# Patient Record
Sex: Male | Born: 1950 | Hispanic: No | Marital: Married | Smoking: Never smoker
Health system: Southern US, Community
[De-identification: ages and names within clinical notes are randomized; demographics above are authoritative.]

## PROBLEM LIST (undated history)

## (undated) DIAGNOSIS — M109 Gout, unspecified: Secondary | ICD-10-CM

## (undated) DIAGNOSIS — E781 Pure hyperglyceridemia: Secondary | ICD-10-CM

## (undated) DIAGNOSIS — H3530 Unspecified macular degeneration: Secondary | ICD-10-CM

## (undated) HISTORY — DX: Unspecified macular degeneration: H35.30

## (undated) HISTORY — PX: COLONOSCOPY WITH PROPOFOL: SHX5780

---
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
## 2007-12-04 ENCOUNTER — Ambulatory Visit: Payer: Self-pay

## 2008-12-30 IMAGING — CR DG HAND COMPLETE 3+V*L*
1 series · 3 of 3 positions shown · non-contrast
Comparison: none

REASON FOR EXAM: injury at work swollen 2nd
COMMENTS:

PROCEDURE:     DXR - DXR HAND LT COMPLETE  W/OBLIQUES  - December 04, 2007 [DATE]
RESULT:     No fracture, dislocation or other acute bony abnormality is
identified.

[Series 1: view not recorded · 0.17mm/px · 3 of 3 slices shown]
[im 1/3]
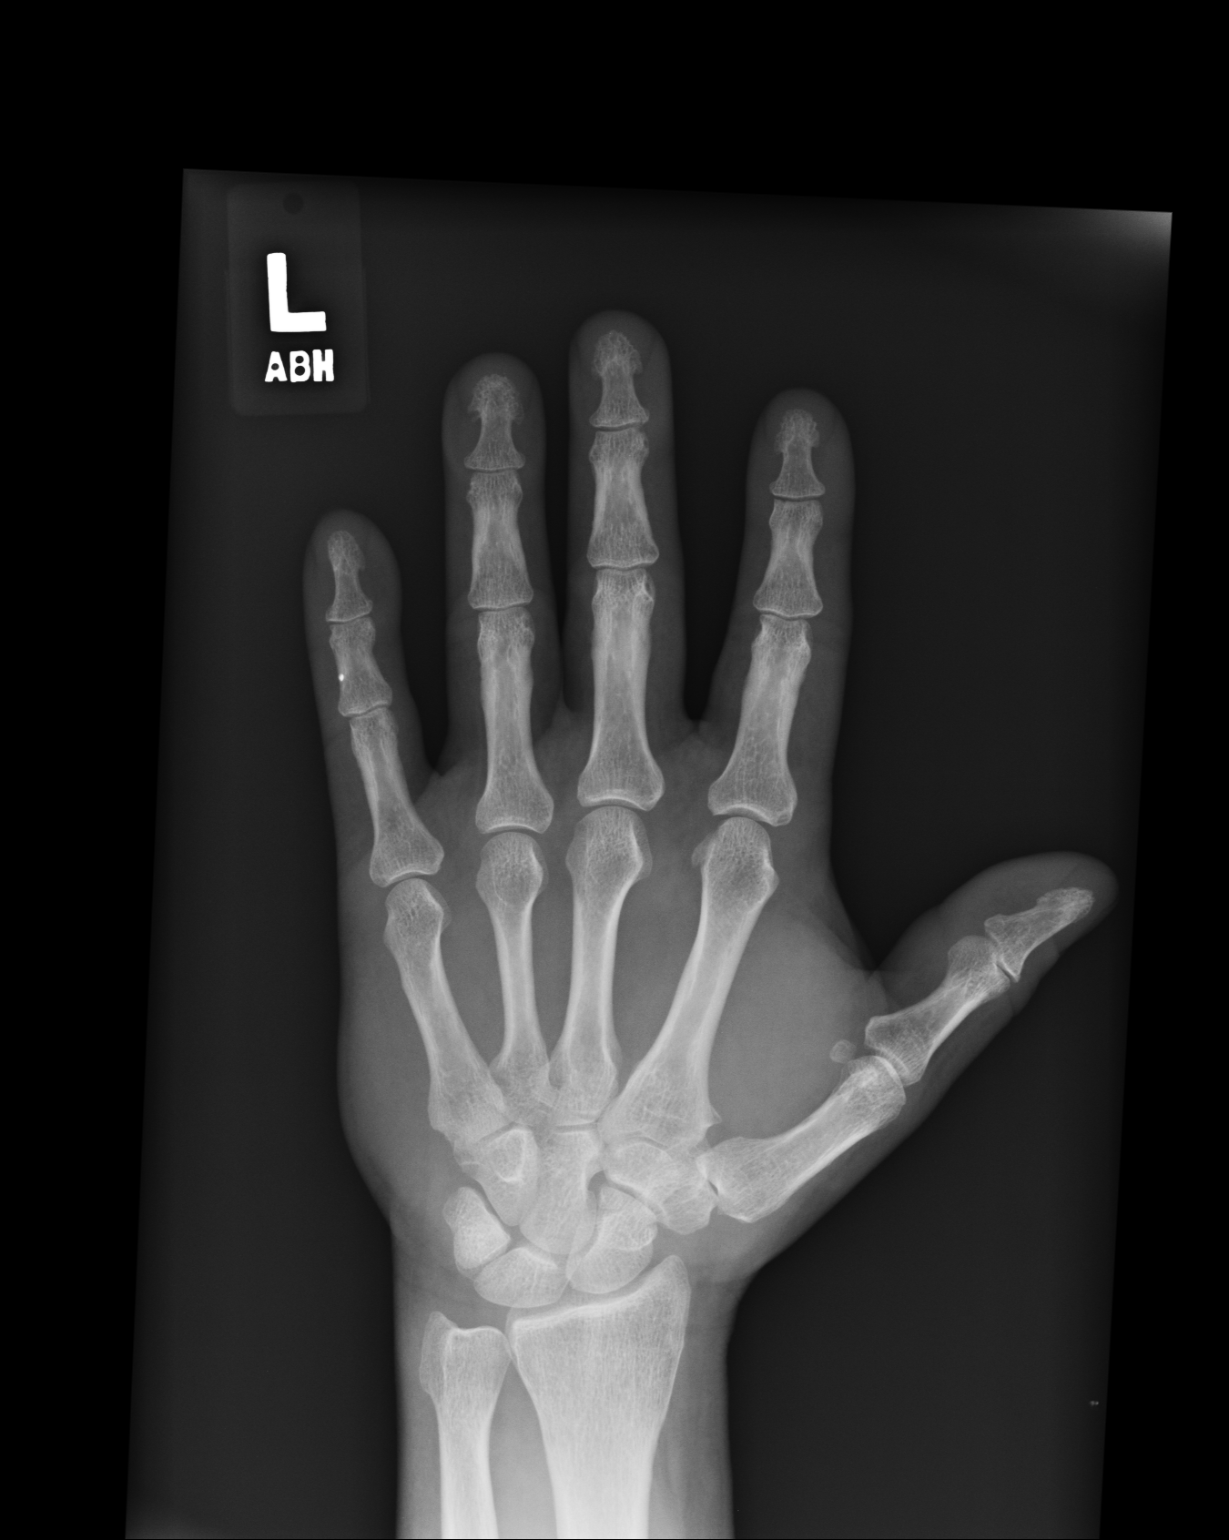
[im 2/3]
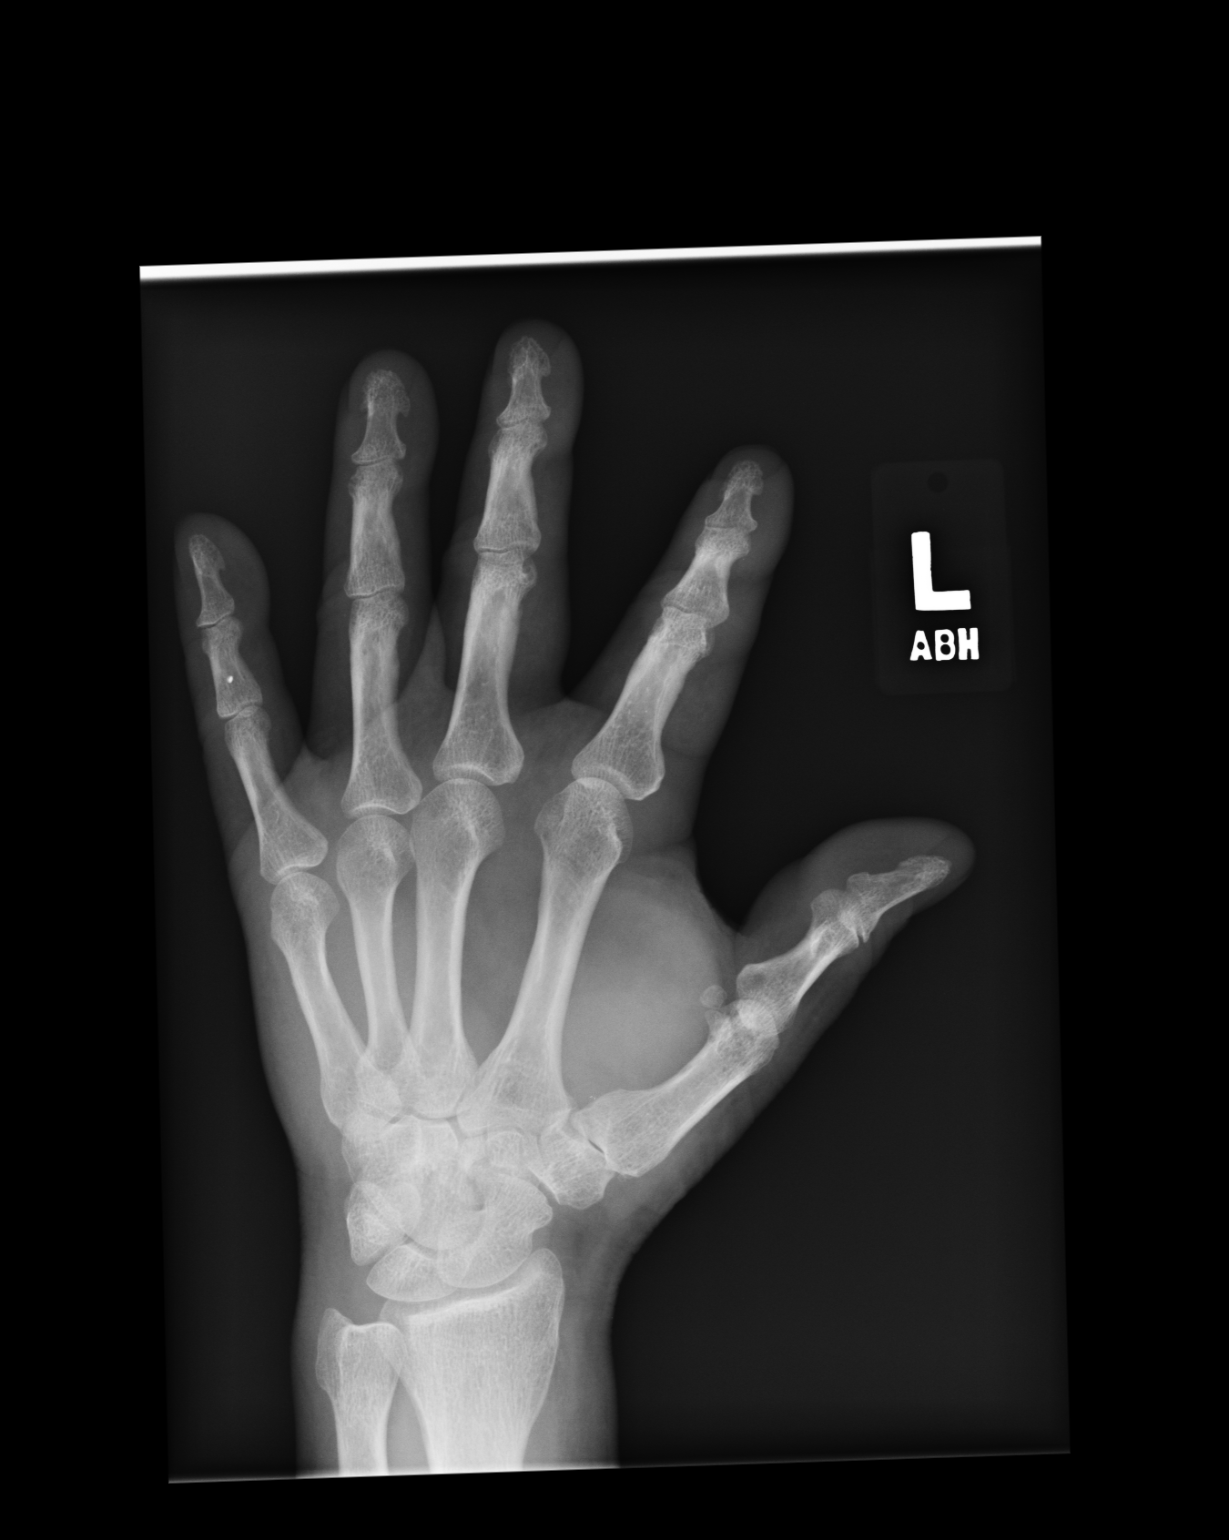
[im 3/3]
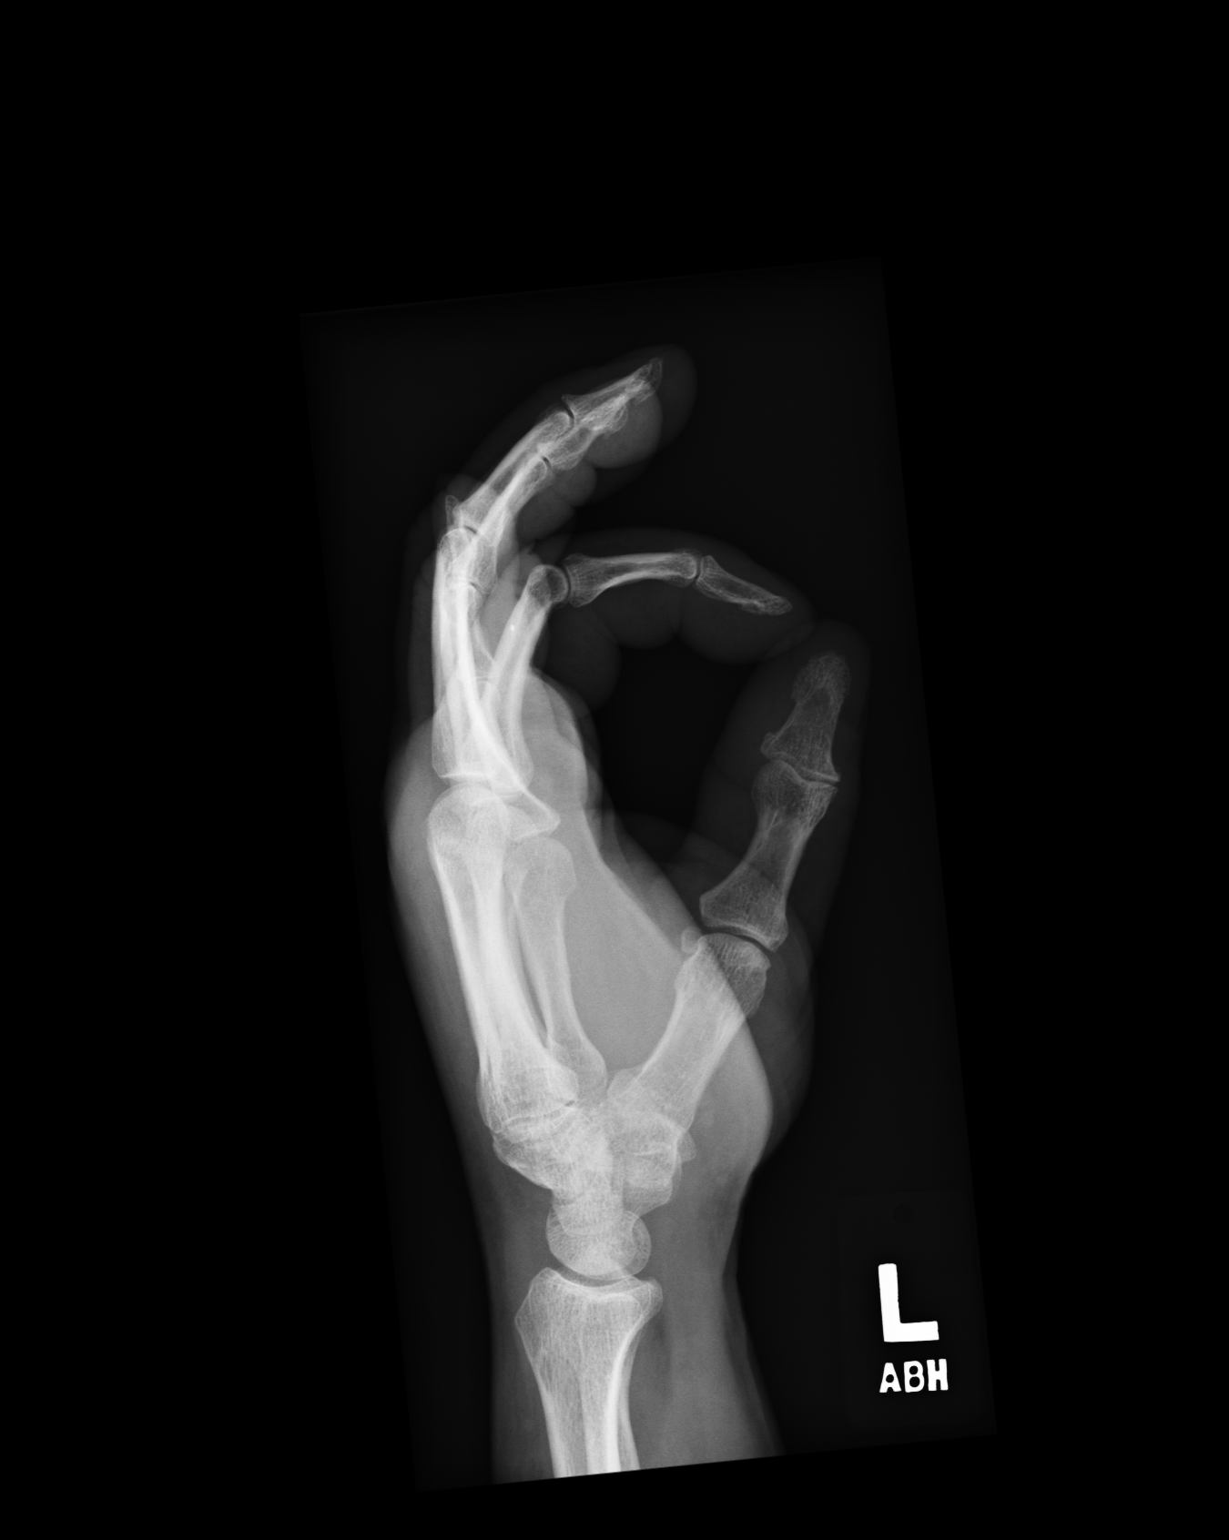

[3 of 3 positions shown; findings below may reference images not displayed]

IMPRESSION: 1. No significant osseous abnormalities are noted.
2. There is incidentally noted a tiny metallic density foreign body
projected over the soft tissues volar to the middle phalanx of the little
finger and consistent with residual change from prior injury.

## 2010-05-10 ENCOUNTER — Ambulatory Visit
Admission: RE | Admit: 2010-05-10 | Discharge: 2010-05-10 | Disposition: A | Discharge status: Home / Self Care | Payer: Worker's Compensation | Source: Ambulatory Visit | Attending: Orthopedic Surgery | Admitting: Orthopedic Surgery

## 2010-05-10 ENCOUNTER — Other Ambulatory Visit: Payer: Self-pay | Admitting: Orthopedic Surgery

## 2010-05-10 DIAGNOSIS — Z77018 Contact with and (suspected) exposure to other hazardous metals: Secondary | ICD-10-CM

## 2010-07-07 HISTORY — PX: ROTATOR CUFF REPAIR: SHX139

## 2012-04-25 ENCOUNTER — Ambulatory Visit: Payer: Self-pay | Admitting: Gastroenterology

## 2012-04-25 LAB — HM COLONOSCOPY

## 2015-05-31 ENCOUNTER — Encounter: Payer: Self-pay | Admitting: Family Medicine

## 2015-05-31 DIAGNOSIS — H3530 Unspecified macular degeneration: Secondary | ICD-10-CM | POA: Insufficient documentation

## 2015-07-08 ENCOUNTER — Ambulatory Visit (INDEPENDENT_AMBULATORY_CARE_PROVIDER_SITE_OTHER): Payer: Managed Care, Other (non HMO) | Admitting: Family Medicine

## 2015-07-08 ENCOUNTER — Encounter: Payer: Self-pay | Admitting: Family Medicine

## 2015-07-08 VITALS — BP 144/102 | HR 68 | Temp 98.4°F | Resp 18 | Ht 68.5 in | Wt 213.0 lb

## 2015-07-08 DIAGNOSIS — Z0000 Encounter for general adult medical examination without abnormal findings: Secondary | ICD-10-CM | POA: Diagnosis not present

## 2015-07-08 DIAGNOSIS — Z23 Encounter for immunization: Secondary | ICD-10-CM | POA: Diagnosis not present

## 2015-07-08 DIAGNOSIS — M1000 Idiopathic gout, unspecified site: Secondary | ICD-10-CM | POA: Diagnosis not present

## 2015-07-08 DIAGNOSIS — Z1159 Encounter for screening for other viral diseases: Secondary | ICD-10-CM | POA: Diagnosis not present

## 2015-07-08 LAB — CBC WITH DIFFERENTIAL/PLATELET
BASOS PCT: 1 % (ref 0–1)
Basophils Absolute: 0.1 10*3/uL (ref 0.0–0.1)
Eosinophils Absolute: 0.3 10*3/uL (ref 0.0–0.7)
Eosinophils Relative: 3 % (ref 0–5)
HEMATOCRIT: 44.8 % (ref 39.0–52.0)
HEMOGLOBIN: 15.5 g/dL (ref 13.0–17.0)
LYMPHS ABS: 1.6 10*3/uL (ref 0.7–4.0)
LYMPHS PCT: 18 % (ref 12–46)
MCH: 31.2 pg (ref 26.0–34.0)
MCHC: 34.6 g/dL (ref 30.0–36.0)
MCV: 90.1 fL (ref 78.0–100.0)
MONO ABS: 0.7 10*3/uL (ref 0.1–1.0)
MONOS PCT: 8 % (ref 3–12)
MPV: 9.2 fL (ref 8.6–12.4)
NEUTROS ABS: 6.2 10*3/uL (ref 1.7–7.7)
Neutrophils Relative %: 70 % (ref 43–77)
Platelets: 264 10*3/uL (ref 150–400)
RBC: 4.97 MIL/uL (ref 4.22–5.81)
RDW: 13.6 % (ref 11.5–15.5)
WBC: 8.8 10*3/uL (ref 4.0–10.5)

## 2015-07-08 LAB — LIPID PANEL
CHOL/HDL RATIO: 3.8 ratio (ref <=5.0)
Cholesterol: 124 mg/dL — ABNORMAL LOW (ref 125–200)
HDL: 33 mg/dL — AB (ref >=40)
LDL CALC: 33 mg/dL (ref <130)
TRIGLYCERIDES: 288 mg/dL — AB (ref <150)
VLDL: 58 mg/dL — AB (ref <30)

## 2015-07-08 LAB — COMPLETE METABOLIC PANEL WITH GFR
ALBUMIN: 4.7 g/dL (ref 3.6–5.1)
ALK PHOS: 97 U/L (ref 40–115)
ALT: 25 U/L (ref 9–46)
AST: 27 U/L (ref 10–35)
BILIRUBIN TOTAL: 0.6 mg/dL (ref 0.2–1.2)
BUN: 16 mg/dL (ref 7–25)
CALCIUM: 9 mg/dL (ref 8.6–10.3)
CO2: 23 mmol/L (ref 20–31)
Chloride: 103 mmol/L (ref 98–110)
Creat: 0.87 mg/dL (ref 0.70–1.25)
Glucose, Bld: 103 mg/dL — ABNORMAL HIGH (ref 70–99)
POTASSIUM: 4.2 mmol/L (ref 3.5–5.3)
Sodium: 141 mmol/L (ref 135–146)
TOTAL PROTEIN: 7.1 g/dL (ref 6.1–8.1)

## 2015-07-08 LAB — HEPATITIS C ANTIBODY: HCV Ab: NEGATIVE

## 2015-07-08 LAB — URIC ACID: Uric Acid, Serum: 5.8 mg/dL (ref 4.0–7.8)

## 2015-07-08 NOTE — Addendum Note (Signed)
Addended by: Legrand RamsWILLIS, SANDY B on: 07/08/2015 08:48 AM   Modules accepted: Orders

## 2015-07-08 NOTE — Progress Notes (Signed)
Subjective:    Patient ID: Joshua Trevino., male    DOB: 01/21/1951, 65 y.o.   MRN: [id number]  HPI  Patient is a very pleasant 65 year old white male here today to establish care. Previously has been a patient of Dr. Vear Clock. Past medical history significant for macular degeneration as well as hypertriglyceridemia. He is due for prostate exam. He is due for the shingles vaccine as well as a tetanus vaccine. He also has a past medical history of gout. His last colonoscopy was performed in 2015 and was normal. He gets colonoscopy every 5 years because his father died from colon cancer. He is due for hepatitis C screening. His blood pressure today is elevated however the patient states that it is never high and it has always been normal. In fact he had normal blood pressure at a [employer] physical in December. Past Medical History  Diagnosis Date  . Macular degeneration    Past Surgical History  Procedure Laterality Date  . Appendectomy  1958  . Rotator cuff repair  07/07/2010    reattach Bicep also   Current Outpatient Prescriptions on File Prior to Visit  Medication Sig Dispense Refill  . allopurinol (ZYLOPRIM) 300 MG tablet Take 300 mg by mouth daily with supper.    Marland Kitchen gemfibrozil (LOPID) 600 MG tablet Take 600 mg by mouth daily with supper.    . Multiple Vitamin (MULTIVITAMIN) tablet Take 1 tablet by mouth daily with supper.    . Multiple Vitamins-Minerals (ICAPS) CAPS Take 2 capsules by mouth 2 (two) times daily.     No current facility-administered medications on file prior to visit.   No Known Allergies Social History   Social History  . Marital Status: Unknown    Spouse Name: N/A  . Number of Children: N/A  . Years of Education: N/A   Occupational History  . Not on file.   Social History Main Topics  . Smoking status: Never Smoker   . Smokeless tobacco: Never Used  . Alcohol Use: No  . Drug Use: No  . Sexual Activity: Not Currently   Other Topics Concern  . Not  on file   Social History Narrative   Family History  Problem Relation Age of Onset  . Hypertension Mother   . Macular degeneration Mother   . Cancer Father   . Macular degeneration Sister   . Early death Paternal Grandfather   . Tuberculosis Paternal Grandfather      Review of Systems  All other systems reviewed and are negative.      Objective:   Physical Exam  Constitutional: He is oriented to person, place, and time. He appears well-developed and well-nourished. No distress.  HENT:  Head: Normocephalic and atraumatic.  Right Ear: External ear normal.  Left Ear: External ear normal.  Nose: Nose normal.  Mouth/Throat: Oropharynx is clear and moist. No oropharyngeal exudate.  Eyes: Conjunctivae and EOM are normal. Pupils are equal, round, and reactive to light. Left eye exhibits no discharge. No scleral icterus.  Neck: Normal range of motion. Neck supple. No JVD present. No tracheal deviation present. No thyromegaly present.  Cardiovascular: Normal rate, regular rhythm, normal heart sounds and intact distal pulses.  Exam reveals no gallop and no friction rub.   No murmur heard. Pulmonary/Chest: Effort normal and breath sounds normal. No stridor. No respiratory distress. He has no wheezes. He has no rales. He exhibits no tenderness.  Abdominal: Soft. Bowel sounds are normal. He exhibits no distension and no  mass. There is no tenderness. There is no rebound and no guarding.  Genitourinary: Rectum normal, prostate normal and penis normal.  Musculoskeletal: Normal range of motion. He exhibits no edema or tenderness.  Lymphadenopathy:    He has no cervical adenopathy.  Neurological: He is alert and oriented to person, place, and time. He has normal reflexes. He displays normal reflexes. No cranial nerve deficit. He exhibits normal muscle tone. Coordination normal.  Skin: Skin is warm. No rash noted. No erythema. No pallor.  Psychiatric: He has a normal mood and affect. His  behavior is normal. Judgment and thought content normal.  Vitals reviewed.         Assessment & Plan:  Need for hepatitis C screening test - Plan: Hepatitis C Ab Reflex HCV RNA, QUANT  Routine general medical examination at a health care facility - Plan: CBC with Differential/Platelet, COMPLETE METABOLIC PANEL WITH GFR, Lipid panel, PSA, Hepatitis C Ab Reflex HCV RNA, QUANT  Physical exam today is normal except for his blood pressure. Patient will check his blood pressure several times over the next 2 weeks and provide me the values. If greater than 140/90, I would start losartan. I will screen the patient for hepatitis C today. Prostate exam is normal I will check a PSA. Colonoscopy is up-to-date. I recommended the shingles vaccine. He will get the tetanus vaccine today. He is not due for a pneumonia vaccine until 66. I will also check a CBC, CMP, fasting lipid panel. I will also check a uric acid level to ensure that his allopurinol was at the appropriate dosage. I am not a big exam of gemfibrozil due to the risk of myalgias and liver problems. I will check a fasting lipid panel and make decisions about possibly switching the patient based on the results.

## 2015-07-09 LAB — PSA: PSA: 1.58 ng/mL (ref <=4.00)

## 2015-07-11 ENCOUNTER — Encounter: Payer: Self-pay | Admitting: Family Medicine

## 2015-07-14 ENCOUNTER — Encounter: Payer: Self-pay | Admitting: *Deleted

## 2015-08-18 ENCOUNTER — Other Ambulatory Visit: Payer: Self-pay | Admitting: Family Medicine

## 2015-08-18 NOTE — Telephone Encounter (Signed)
Refill appropriate and filled per protocol. 

## 2015-08-24 ENCOUNTER — Other Ambulatory Visit: Payer: Self-pay | Admitting: *Deleted

## 2015-08-24 MED ORDER — GEMFIBROZIL 600 MG PO TABS: 600.0000 mg | ORAL_TABLET | Freq: Every day | ORAL | Status: DC

## 2015-08-24 MED ORDER — ALLOPURINOL 300 MG PO TABS: 300.0000 mg | ORAL_TABLET | Freq: Every day | ORAL | Status: DC

## 2015-08-24 NOTE — Telephone Encounter (Signed)
Received fax requesting refill on Allopurinol and Gemfibrozil.  Refill appropriate and filled per protocol.

## 2015-11-24 ENCOUNTER — Other Ambulatory Visit: Payer: Self-pay | Admitting: Family Medicine

## 2015-11-24 MED ORDER — GEMFIBROZIL 600 MG PO TABS: 600.0000 mg | ORAL_TABLET | Freq: Every day | ORAL | 1 refills | Status: DC

## 2015-11-24 MED ORDER — ALLOPURINOL 300 MG PO TABS: 300.0000 mg | ORAL_TABLET | Freq: Every day | ORAL | 1 refills | Status: DC

## 2015-11-24 NOTE — Telephone Encounter (Signed)
Medication called/sent to requested pharmacy  

## 2015-12-22 ENCOUNTER — Ambulatory Visit (INDEPENDENT_AMBULATORY_CARE_PROVIDER_SITE_OTHER): Payer: BLUE CROSS/BLUE SHIELD | Admitting: Family Medicine

## 2015-12-22 VITALS — BP 144/94 | HR 90 | Temp 98.1°F | Resp 18 | Wt 210.0 lb

## 2015-12-22 DIAGNOSIS — J019 Acute sinusitis, unspecified: Secondary | ICD-10-CM

## 2015-12-22 MED ORDER — AMOXICILLIN-POT CLAVULANATE 875-125 MG PO TABS: 1.0000 | ORAL_TABLET | Freq: Two times a day (BID) | ORAL | 0 refills | Status: DC

## 2015-12-22 NOTE — Progress Notes (Signed)
   Subjective:    Patient ID: Joshua Bouillonlem Calvin Magwood Jr., male    DOB: 01/21/1951, 65 y.o.   MRN: [id number]  HPI Patient reports 1-2 weeks of pressure behind his right eye. He is also having a headache in that area. He reports mild disequilibrium. He reports pressure and fullness in his ear. He reports head congestion. He reports that he gets similar symptoms when he develops a sinus infection in the past. He denies any pain or pressure in his maxillary sinus area he denies any sore throat. He does report some rhinorrhea and congestion. Past Medical History:  Diagnosis Date  . Macular degeneration    Past Surgical History:  Procedure Laterality Date  . APPENDECTOMY  1958  . ROTATOR CUFF REPAIR  07/07/2010   reattach Bicep also   Current Outpatient Prescriptions on File Prior to Visit  Medication Sig Dispense Refill  . allopurinol (ZYLOPRIM) 300 MG tablet Take 1 tablet (300 mg total) by mouth daily. 90 tablet 1  . gemfibrozil (LOPID) 600 MG tablet Take 1 tablet (600 mg total) by mouth daily. 90 tablet 1  . Multiple Vitamin (MULTIVITAMIN) tablet Take 1 tablet by mouth daily with supper.    . Multiple Vitamins-Minerals (ICAPS) CAPS Take 2 capsules by mouth 2 (two) times daily.    . diphenhydrAMINE (BENADRYL) 25 MG tablet Take 25 mg by mouth every 6 (six) hours as needed for allergies.     No current facility-administered medications on file prior to visit.    No Known Allergies Social History   Social History  . Marital status: Unknown    Spouse name: N/A  . Number of children: N/A  . Years of education: N/A   Occupational History  . Not on file.   Social History Main Topics  . Smoking status: Never Smoker  . Smokeless tobacco: Never Used  . Alcohol use No  . Drug use: No  . Sexual activity: Not Currently   Other Topics Concern  . Not on file   Social History Narrative  . No narrative on file      Review of Systems     Objective:   Physical Exam  Constitutional: He  is oriented to person, place, and time. He appears well-developed and well-nourished. No distress.  HENT:  Head: Normocephalic and atraumatic.  Right Ear: External ear and ear canal normal.  Left Ear: External ear and ear canal normal.  Nose: Mucosal edema present. No rhinorrhea. Right sinus exhibits frontal sinus tenderness.  Mouth/Throat: Oropharynx is clear and moist. No oropharyngeal exudate.  Cardiovascular: Normal rate, regular rhythm and normal heart sounds.   Pulmonary/Chest: Effort normal and breath sounds normal.  Neurological: He is alert and oriented to person, place, and time. He has normal reflexes. He displays normal reflexes. No cranial nerve deficit. He exhibits normal muscle tone. Coordination normal.  Skin: He is not diaphoretic.  Vitals reviewed.         Assessment & Plan:  Acute rhinosinusitis - Plan: amoxicillin-clavulanate (AUGMENTIN) 875-125 MG tablet  I suspect that he has sinusitis. Begin Augmentin 875 mg by mouth twice a day for 10 days. Recheck in one week if no better. Keep a closer eye on his blood pressure is elevated today.

## 2016-01-17 ENCOUNTER — Ambulatory Visit (INDEPENDENT_AMBULATORY_CARE_PROVIDER_SITE_OTHER): Payer: BLUE CROSS/BLUE SHIELD | Admitting: Family Medicine

## 2016-01-17 ENCOUNTER — Encounter: Payer: Self-pay | Admitting: Family Medicine

## 2016-01-17 VITALS — BP 142/94 | HR 72 | Temp 98.3°F | Resp 18 | Ht 68.5 in | Wt 214.0 lb

## 2016-01-17 DIAGNOSIS — R5383 Other fatigue: Secondary | ICD-10-CM

## 2016-01-17 LAB — COMPLETE METABOLIC PANEL WITH GFR
ALT: 24 U/L (ref 9–46)
AST: 28 U/L (ref 10–35)
Albumin: 4.3 g/dL (ref 3.6–5.1)
Alkaline Phosphatase: 93 U/L (ref 40–115)
BILIRUBIN TOTAL: 0.5 mg/dL (ref 0.2–1.2)
BUN: 15 mg/dL (ref 7–25)
CALCIUM: 9.1 mg/dL (ref 8.6–10.3)
CHLORIDE: 101 mmol/L (ref 98–110)
CO2: 29 mmol/L (ref 20–31)
Creat: 0.86 mg/dL (ref 0.70–1.25)
Glucose, Bld: 97 mg/dL (ref 70–99)
Potassium: 3.9 mmol/L (ref 3.5–5.3)
Sodium: 139 mmol/L (ref 135–146)
Total Protein: 7 g/dL (ref 6.1–8.1)

## 2016-01-17 LAB — CBC WITH DIFFERENTIAL/PLATELET
BASOS ABS: 82 {cells}/uL (ref 0–200)
Basophils Relative: 1 %
EOS ABS: 246 {cells}/uL (ref 15–500)
Eosinophils Relative: 3 %
HCT: 45.3 % (ref 38.5–50.0)
Hemoglobin: 15.1 g/dL (ref 13.0–17.0)
LYMPHS PCT: 19 %
Lymphs Abs: 1558 cells/uL (ref 850–3900)
MCH: 30.1 pg (ref 27.0–33.0)
MCHC: 33.3 g/dL (ref 32.0–36.0)
MCV: 90.2 fL (ref 80.0–100.0)
MONOS PCT: 9 %
MPV: 9.6 fL (ref 7.5–12.5)
Monocytes Absolute: 738 cells/uL (ref 200–950)
NEUTROS PCT: 68 %
Neutro Abs: 5576 cells/uL (ref 1500–7800)
PLATELETS: 282 10*3/uL (ref 140–400)
RBC: 5.02 MIL/uL (ref 4.20–5.80)
RDW: 13.8 % (ref 11.0–15.0)
WBC: 8.2 10*3/uL (ref 3.8–10.8)

## 2016-01-17 LAB — TSH: TSH: 1.16 m[IU]/L (ref 0.40–4.50)

## 2016-01-17 NOTE — Progress Notes (Signed)
Subjective:    Patient ID: Gwenyth Bouillonlem Calvin Soots Jr., male    DOB: 11/27/1950, 65 y.o.   MRN: [id number]  HPI  12/22/15 Patient reports 1-2 weeks of pressure behind his right eye. He is also having a headache in that area. He reports mild disequilibrium. He reports pressure and fullness in his ear. He reports head congestion. He reports that he gets similar symptoms when he develops a sinus infection in the past. He denies any pain or pressure in his maxillary sinus area he denies any sore throat. He does report some rhinorrhea and congestion.  At that time, my plan was: I suspect that he has sinusitis. Begin Augmentin 875 mg by mouth twice a day for 10 days. Recheck in one week if no better. Keep a closer eye on his blood pressure is elevated today.  01/17/16 Symptoms have not improved. He continues to have a dull constant headache. He also reports occasional mild disequilibrium. Now he is reporting severe fatigue. He just has no energy or desire to do anything. He is also having some mild blurry vision. He recently saw his eye doctor and his exam showed no significant change. He denies any chest pain shortness of breath or dyspnea on exertion. He denies any cough or hemoptysis. He denies any fevers or chills or night sweats or bruising. He denies any nausea vomiting or diarrhea. He denies any constipation. He denies any blood in his stools. He denies any blood in his urine or dysuria. He denies any bone pains are diffuse arthralgias are diffuse muscle pains. Past Medical History:  Diagnosis Date  . Macular degeneration    Past Surgical History:  Procedure Laterality Date  . APPENDECTOMY  1958  . ROTATOR CUFF REPAIR  07/07/2010   reattach Bicep also   Current Outpatient Prescriptions on File Prior to Visit  Medication Sig Dispense Refill  . allopurinol (ZYLOPRIM) 300 MG tablet Take 1 tablet (300 mg total) by mouth daily. 90 tablet 1  . diphenhydrAMINE (BENADRYL) 25 MG tablet Take 25 mg by  mouth every 6 (six) hours as needed for allergies.    Marland Kitchen. gemfibrozil (LOPID) 600 MG tablet Take 1 tablet (600 mg total) by mouth daily. 90 tablet 1  . Multiple Vitamin (MULTIVITAMIN) tablet Take 1 tablet by mouth daily with supper.    . Multiple Vitamins-Minerals (ICAPS) CAPS Take 2 capsules by mouth 2 (two) times daily.     No current facility-administered medications on file prior to visit.    No Known Allergies Social History   Social History  . Marital status: Unknown    Spouse name: N/A  . Number of children: N/A  . Years of education: N/A   Occupational History  . Not on file.   Social History Main Topics  . Smoking status: Never Smoker  . Smokeless tobacco: Never Used  . Alcohol use No  . Drug use: No  . Sexual activity: Not Currently   Other Topics Concern  . Not on file   Social History Narrative  . No narrative on file      Review of Systems     Objective:   Physical Exam  Constitutional: He is oriented to person, place, and time. He appears well-developed and well-nourished. No distress.  HENT:  Head: Normocephalic and atraumatic.  Right Ear: External ear and ear canal normal.  Left Ear: External ear and ear canal normal.  Nose: No mucosal edema or rhinorrhea. Right sinus exhibits no maxillary sinus tenderness and no  frontal sinus tenderness. Left sinus exhibits no maxillary sinus tenderness and no frontal sinus tenderness.  Mouth/Throat: Oropharynx is clear and moist. No oropharyngeal exudate.  Cardiovascular: Normal rate, regular rhythm and normal heart sounds.   Pulmonary/Chest: Effort normal and breath sounds normal.  Neurological: He is alert and oriented to person, place, and time. He has normal reflexes. No cranial nerve deficit. He exhibits normal muscle tone. Coordination normal.  Skin: He is not diaphoretic.  Vitals reviewed.         Assessment & Plan:  Other fatigue - Plan: CBC with Differential/Platelet, COMPLETE METABOLIC PANEL WITH  GFR, TSH, Testosterone  His history and his exam give me no clues as to the cause of his fatigue. I will check a CBC CMP TSH and testosterone. We discussed today performing a CT scan of his head given his dull headache, blurred vision, dizziness to evaluate for chronic sinusitis versus space-occupying lesion in the brain. Neurologic exam is completely normal and I believe the likelihood of a space-occupying lesion in the brain is unlikely. Therefore will await the results of his lab work. If labs do not provide a clue as to the cause of his symptoms, I would proceed with a CT scan of his sinuses/head. His blood pressure here today is elevated however he has been checking at home and all of his blood pressure is between 110 and 130/80-90. I believe these are acceptable.

## 2016-01-18 LAB — TESTOSTERONE: TESTOSTERONE: 229 ng/dL — AB (ref 250–827)

## 2016-02-09 ENCOUNTER — Encounter: Payer: Self-pay | Admitting: Family Medicine

## 2016-02-09 ENCOUNTER — Ambulatory Visit (INDEPENDENT_AMBULATORY_CARE_PROVIDER_SITE_OTHER): Payer: BLUE CROSS/BLUE SHIELD | Admitting: Family Medicine

## 2016-02-09 VITALS — BP 136/80 | HR 74 | Temp 98.7°F | Resp 18 | Ht 68.5 in | Wt 212.0 lb

## 2016-02-09 DIAGNOSIS — R5383 Other fatigue: Secondary | ICD-10-CM | POA: Diagnosis not present

## 2016-02-09 NOTE — Progress Notes (Signed)
Subjective:    Patient ID: Joshua Bouillonlem Calvin Andrades Jr., male    DOB: 05/14/1950, 65 y.o.   MRN: [id number]  Hypertension     12/22/15 Patient reports 1-2 weeks of pressure behind his right eye. He is also having a headache in that area. He reports mild disequilibrium. He reports pressure and fullness in his ear. He reports head congestion. He reports that he gets similar symptoms when he develops a sinus infection in the past. He denies any pain or pressure in his maxillary sinus area he denies any sore throat. He does report some rhinorrhea and congestion.  At that time, my plan was: I suspect that he has sinusitis. Begin Augmentin 875 mg by mouth twice a day for 10 days. Recheck in one week if no better. Keep a closer eye on his blood pressure is elevated today.  01/17/16 Symptoms have not improved. He continues to have a dull constant headache. He also reports occasional mild disequilibrium. Now he is reporting severe fatigue. He just has no energy or desire to do anything. He is also having some mild blurry vision. He recently saw his eye doctor and his exam showed no significant change. He denies any chest pain shortness of breath or dyspnea on exertion. He denies any cough or hemoptysis. He denies any fevers or chills or night sweats or bruising. He denies any nausea vomiting or diarrhea. He denies any constipation. He denies any blood in his stools. He denies any blood in his urine or dysuria. He denies any bone pains are diffuse arthralgias are diffuse muscle pains.  At that time, my plan was: His history and his exam give me no clues as to the cause of his fatigue. I will check a CBC CMP TSH and testosterone. We discussed today performing a CT scan of his head given his dull headache, blurred vision, dizziness to evaluate for chronic sinusitis versus space-occupying lesion in the brain. Neurologic exam is completely normal and I believe the likelihood of a space-occupying lesion in the brain is  unlikely. Therefore will await the results of his lab work. If labs do not provide a clue as to the cause of his symptoms, I would proceed with a CT scan of his sinuses/head. His blood pressure here today is elevated however he has been checking at home and all of his blood pressure is between 110 and 130/80-90. I believe these are acceptable.  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 He believes this was causing eyestrain and was contributing to his headaches. Since receiving treatment for this, his headaches have essentially resolved. He continues to have fatigue. Epworth Sleepiness Scale is only 3 out of 18. He denies symptoms of apnea. Lab work was significant for a slightly low testosterone level. He is here today to discuss further Past Medical History:  Diagnosis Date  . Macular degeneration    Past Surgical History:  Procedure Laterality Date  . APPENDECTOMY  1958  . ROTATOR CUFF REPAIR  07/07/2010   reattach Bicep also   Current Outpatient Prescriptions on File Prior to Visit  Medication Sig Dispense Refill  . allopurinol (ZYLOPRIM) 300 MG tablet Take 1 tablet (300 mg total) by mouth daily. 90 tablet 1  . diphenhydrAMINE (BENADRYL) 25 MG tablet Take 25 mg by mouth every 6 (six) hours as needed for allergies.    Marland Kitchen. gemfibrozil (LOPID) 600 MG tablet Take 1 tablet (600 mg total) by mouth  daily. 90 tablet 1  . Multiple Vitamin (MULTIVITAMIN) tablet Take 1 tablet by mouth daily with supper.    . Multiple Vitamins-Minerals (ICAPS) CAPS Take 2 capsules by mouth 2 (two) times daily.     No current facility-administered medications on file prior to visit.    No Known Allergies Social History   Social History  . Marital status: Unknown    Spouse name: N/A  . Number of children: N/A  . Years of education: N/A   Occupational History  . Not on file.   Social History Main Topics  . Smoking status: Never  Smoker  . Smokeless tobacco: Never Used  . Alcohol use No  . Drug use: No  . Sexual activity: Not Currently   Other Topics Concern  . Not on file   Social History Narrative  . No narrative on file      Review of Systems     Objective:   Physical Exam  Constitutional: He is oriented to person, place, and time. He appears well-developed and well-nourished. No distress.  HENT:  Head: Normocephalic and atraumatic.  Right Ear: External ear and ear canal normal.  Left Ear: External ear and ear canal normal.  Nose: No mucosal edema or rhinorrhea. Right sinus exhibits no maxillary sinus tenderness and no frontal sinus tenderness. Left sinus exhibits no maxillary sinus tenderness and no frontal sinus tenderness.  Mouth/Throat: Oropharynx is clear and moist. No oropharyngeal exudate.  Cardiovascular: Normal rate, regular rhythm and normal heart sounds.   Pulmonary/Chest: Effort normal and breath sounds normal.  Neurological: He is alert and oriented to person, place, and time. He has normal reflexes. No cranial nerve deficit. He exhibits normal muscle tone. Coordination normal.  Skin: He is not diaphoretic.  Vitals reviewed.         Assessment & Plan:  Other fatigue At the present time, the patient is not interested in a sleep study. No longer needs imaging of the head as his headaches have stopped. We did discuss testosterone replacement including his risk and benefits. He elects against this at the present time. If he changes his mind I will check an FSH, LH, prolactin level as well as a PSA and start the patient on testosterone replacement for at least 3 months

## 2016-05-24 ENCOUNTER — Telehealth: Payer: Self-pay | Admitting: Family Medicine

## 2016-05-24 MED ORDER — ALLOPURINOL 300 MG PO TABS: 300.0000 mg | ORAL_TABLET | Freq: Every day | ORAL | 1 refills | Status: DC

## 2016-05-24 MED ORDER — GEMFIBROZIL 600 MG PO TABS: 600.0000 mg | ORAL_TABLET | Freq: Every day | ORAL | 1 refills | Status: DC

## 2016-05-24 NOTE — Telephone Encounter (Signed)
Pt needs refill on lopid and zyloprim, wants it through mail order.

## 2016-05-24 NOTE — Telephone Encounter (Signed)
Medication called/sent to requested pharmacy  

## 2016-06-08 DIAGNOSIS — H353222 Exudative age-related macular degeneration, left eye, with inactive choroidal neovascularization: Secondary | ICD-10-CM | POA: Diagnosis not present

## 2016-06-08 DIAGNOSIS — H353211 Exudative age-related macular degeneration, right eye, with active choroidal neovascularization: Secondary | ICD-10-CM | POA: Diagnosis not present

## 2016-06-18 ENCOUNTER — Other Ambulatory Visit: Payer: Self-pay | Admitting: Family Medicine

## 2016-06-18 MED ORDER — GEMFIBROZIL 600 MG PO TABS: 600.0000 mg | ORAL_TABLET | Freq: Every day | ORAL | 1 refills | Status: DC

## 2016-06-18 MED ORDER — ALLOPURINOL 300 MG PO TABS: 300.0000 mg | ORAL_TABLET | Freq: Every day | ORAL | 1 refills | Status: DC

## 2016-06-19 ENCOUNTER — Other Ambulatory Visit: Payer: Self-pay | Admitting: *Deleted

## 2016-06-19 MED ORDER — GEMFIBROZIL 600 MG PO TABS: 600.0000 mg | ORAL_TABLET | Freq: Every day | ORAL | 1 refills | Status: DC

## 2016-06-19 MED ORDER — ALLOPURINOL 300 MG PO TABS: 300.0000 mg | ORAL_TABLET | Freq: Every day | ORAL | 1 refills | Status: DC

## 2016-07-09 ENCOUNTER — Ambulatory Visit (INDEPENDENT_AMBULATORY_CARE_PROVIDER_SITE_OTHER): Payer: Medicare HMO | Admitting: Family Medicine

## 2016-07-09 ENCOUNTER — Encounter: Payer: Self-pay | Admitting: Family Medicine

## 2016-07-09 VITALS — BP 132/82 | HR 78 | Temp 98.0°F | Resp 18 | Ht 68.5 in | Wt 212.0 lb

## 2016-07-09 DIAGNOSIS — E781 Pure hyperglyceridemia: Secondary | ICD-10-CM | POA: Diagnosis not present

## 2016-07-09 DIAGNOSIS — Z125 Encounter for screening for malignant neoplasm of prostate: Secondary | ICD-10-CM | POA: Diagnosis not present

## 2016-07-09 DIAGNOSIS — Z0000 Encounter for general adult medical examination without abnormal findings: Secondary | ICD-10-CM

## 2016-07-09 DIAGNOSIS — Z8739 Personal history of other diseases of the musculoskeletal system and connective tissue: Secondary | ICD-10-CM | POA: Diagnosis not present

## 2016-07-09 DIAGNOSIS — Z23 Encounter for immunization: Secondary | ICD-10-CM

## 2016-07-09 LAB — COMPLETE METABOLIC PANEL WITH GFR
ALBUMIN: 4.4 g/dL (ref 3.6–5.1)
ALK PHOS: 98 U/L (ref 40–115)
ALT: 17 U/L (ref 9–46)
AST: 20 U/L (ref 10–35)
BILIRUBIN TOTAL: 0.4 mg/dL (ref 0.2–1.2)
BUN: 14 mg/dL (ref 7–25)
CALCIUM: 9.2 mg/dL (ref 8.6–10.3)
CO2: 28 mmol/L (ref 20–31)
Chloride: 104 mmol/L (ref 98–110)
Creat: 0.88 mg/dL (ref 0.70–1.25)
GFR, Est African American: >89 mL/min (ref >=60)
GFR, Est Non African American: >89 mL/min (ref >=60)
GLUCOSE: 111 mg/dL — AB (ref 70–99)
POTASSIUM: 4.4 mmol/L (ref 3.5–5.3)
SODIUM: 141 mmol/L (ref 135–146)
TOTAL PROTEIN: 6.9 g/dL (ref 6.1–8.1)

## 2016-07-09 LAB — CBC WITH DIFFERENTIAL/PLATELET
BASOS ABS: 0 {cells}/uL (ref 0–200)
Basophils Relative: 0 %
Eosinophils Absolute: 450 cells/uL (ref 15–500)
Eosinophils Relative: 5 %
HEMATOCRIT: 44 % (ref 38.5–50.0)
HEMOGLOBIN: 14.8 g/dL (ref 13.0–17.0)
LYMPHS ABS: 1800 {cells}/uL (ref 850–3900)
Lymphocytes Relative: 20 %
MCH: 30.3 pg (ref 27.0–33.0)
MCHC: 33.6 g/dL (ref 32.0–36.0)
MCV: 90.2 fL (ref 80.0–100.0)
MONO ABS: 810 {cells}/uL (ref 200–950)
MPV: 9.4 fL (ref 7.5–12.5)
Monocytes Relative: 9 %
NEUTROS ABS: 5940 {cells}/uL (ref 1500–7800)
NEUTROS PCT: 66 %
Platelets: 283 10*3/uL (ref 140–400)
RBC: 4.88 MIL/uL (ref 4.20–5.80)
RDW: 13.6 % (ref 11.0–15.0)
WBC: 9 10*3/uL (ref 3.8–10.8)

## 2016-07-09 LAB — LIPID PANEL
CHOL/HDL RATIO: 3.8 ratio (ref <5.0)
CHOLESTEROL: 127 mg/dL (ref <200)
HDL: 33 mg/dL — ABNORMAL LOW (ref >40)
LDL Cholesterol: 28 mg/dL (ref <100)
Triglycerides: 330 mg/dL — ABNORMAL HIGH (ref <150)
VLDL: 66 mg/dL — ABNORMAL HIGH (ref <30)

## 2016-07-09 LAB — URIC ACID: URIC ACID, SERUM: 4.9 mg/dL (ref 4.0–8.0)

## 2016-07-09 NOTE — Progress Notes (Signed)
Subjective:    Patient ID: Joshua Trevino., male    DOB: 10/20/1950, 66 y.o.   MRN: [id number]  HPI  Patient is a very pleasant 66 year old white male here for CPE.  Past medical history significant for macular degeneration as well as hypertriglyceridemia. He is due for prostate exam. He is due for the shingles vaccine, which I recommended shingrix and pneumovax 23.  He also has a past medical history of gout. His last colonoscopy was performed in 2015 and was normal. He gets colonoscopy every 5 years because his father died from colon cancer. Hepatitis C screening is up-to-date. He declines HIV screening. Past Medical History:  Diagnosis Date  . Macular degeneration    Past Surgical History:  Procedure Laterality Date  . APPENDECTOMY  1958  . ROTATOR CUFF REPAIR  07/07/2010   reattach Bicep also   Current Outpatient Prescriptions on File Prior to Visit  Medication Sig Dispense Refill  . allopurinol (ZYLOPRIM) 300 MG tablet Take 1 tablet (300 mg total) by mouth daily. 90 tablet 1  . diphenhydrAMINE (BENADRYL) 25 MG tablet Take 25 mg by mouth every 6 (six) hours as needed for allergies.    Marland Kitchen gemfibrozil (LOPID) 600 MG tablet Take 1 tablet (600 mg total) by mouth daily. 90 tablet 1  . Multiple Vitamin (MULTIVITAMIN) tablet Take 1 tablet by mouth daily with supper.    . Multiple Vitamins-Minerals (ICAPS) CAPS Take 2 capsules by mouth 2 (two) times daily.     No current facility-administered medications on file prior to visit.    No Known Allergies Social History   Social History  . Marital status: Unknown    Spouse name: N/A  . Number of children: N/A  . Years of education: N/A   Occupational History  . Not on file.   Social History Main Topics  . Smoking status: Never Smoker  . Smokeless tobacco: Never Used  . Alcohol use No  . Drug use: No  . Sexual activity: Not Currently   Other Topics Concern  . Not on file   Social History Narrative  . No narrative on  file   Family History  Problem Relation Age of Onset  . Hypertension Mother   . Macular degeneration Mother   . Cancer Father   . Macular degeneration Sister   . Early death Paternal Grandfather   . Tuberculosis Paternal Grandfather      Review of Systems  All other systems reviewed and are negative.      Objective:   Physical Exam  Constitutional: He is oriented to person, place, and time. He appears well-developed and well-nourished. No distress.  HENT:  Head: Normocephalic and atraumatic.  Right Ear: External ear normal.  Left Ear: External ear normal.  Nose: Nose normal.  Mouth/Throat: Oropharynx is clear and moist. No oropharyngeal exudate.  Eyes: Conjunctivae and EOM are normal. Pupils are equal, round, and reactive to light. Left eye exhibits no discharge. No scleral icterus.  Neck: Normal range of motion. Neck supple. No JVD present. No tracheal deviation present. No thyromegaly present.  Cardiovascular: Normal rate, regular rhythm, normal heart sounds and intact distal pulses.  Exam reveals no gallop and no friction rub.   No murmur heard. Pulmonary/Chest: Effort normal and breath sounds normal. No stridor. No respiratory distress. He has no wheezes. He has no rales. He exhibits no tenderness.  Abdominal: Soft. Bowel sounds are normal. He exhibits no distension and no mass. There is no tenderness. There is no  rebound and no guarding.  Genitourinary: Rectum normal, prostate normal and penis normal.  Musculoskeletal: Normal range of motion. He exhibits no edema or tenderness.  Lymphadenopathy:    He has no cervical adenopathy.  Neurological: He is alert and oriented to person, place, and time. He has normal reflexes. No cranial nerve deficit. He exhibits normal muscle tone. Coordination normal.  Skin: Skin is warm. No rash noted. No erythema. No pallor.  Psychiatric: He has a normal mood and affect. His behavior is normal. Judgment and thought content normal.  Vitals  reviewed.         Assessment & Plan:  Routine general medical examination at a health care facility  History of gout - Plan: Uric acid  Prostate cancer screening - Plan: PSA  Hypertriglyceridemia - Plan: CBC with Differential/Platelet, COMPLETE METABOLIC PANEL WITH GFR, Lipid panel  Physical examination is completely normal. Prostate exam is normal. Patient declines HIV screening. He is due for colonoscopy next year. I will check a CBC, CMP, fasting lipid panel. Goal LDL cholesterol is less than 130. Goal triglycerides are less than 200. I will also screen the patient for prostate cancer with a PSA. Patient received Pneumovax 23 today. I recommended a shingles vaccine but he will check on the price. Blood pressure today is excellent

## 2016-07-09 NOTE — Addendum Note (Signed)
Addended by: Legrand Rams B on: 07/09/2016 12:29 PM   Modules accepted: Orders

## 2016-07-10 LAB — PSA: PSA: 1.6 ng/mL (ref <=4.0)

## 2016-08-10 DIAGNOSIS — H353211 Exudative age-related macular degeneration, right eye, with active choroidal neovascularization: Secondary | ICD-10-CM | POA: Diagnosis not present

## 2016-10-09 ENCOUNTER — Ambulatory Visit (INDEPENDENT_AMBULATORY_CARE_PROVIDER_SITE_OTHER): Payer: Medicare HMO | Admitting: Family Medicine

## 2016-10-09 ENCOUNTER — Encounter: Payer: Self-pay | Admitting: Family Medicine

## 2016-10-09 VITALS — BP 130/78 | HR 68 | Temp 98.1°F | Resp 16 | Ht 68.5 in | Wt 207.0 lb

## 2016-10-09 DIAGNOSIS — E8881 Metabolic syndrome: Secondary | ICD-10-CM | POA: Diagnosis not present

## 2016-10-09 LAB — COMPLETE METABOLIC PANEL WITH GFR
ALK PHOS: 98 U/L (ref 40–115)
ALT: 21 U/L (ref 9–46)
AST: 28 U/L (ref 10–35)
Albumin: 4.3 g/dL (ref 3.6–5.1)
BUN: 12 mg/dL (ref 7–25)
CO2: 26 mmol/L (ref 20–31)
Calcium: 9.3 mg/dL (ref 8.6–10.3)
Chloride: 102 mmol/L (ref 98–110)
Creat: 0.93 mg/dL (ref 0.70–1.25)
GFR, EST NON AFRICAN AMERICAN: 86 mL/min (ref >=60)
GLUCOSE: 101 mg/dL — AB (ref 70–99)
POTASSIUM: 4.5 mmol/L (ref 3.5–5.3)
SODIUM: 142 mmol/L (ref 135–146)
Total Bilirubin: 0.5 mg/dL (ref 0.2–1.2)
Total Protein: 6.8 g/dL (ref 6.1–8.1)

## 2016-10-09 LAB — LIPID PANEL
Cholesterol: 125 mg/dL (ref <200)
HDL: 28 mg/dL — AB (ref >40)
TRIGLYCERIDES: 447 mg/dL — AB (ref <150)
Total CHOL/HDL Ratio: 4.5 Ratio (ref <5.0)

## 2016-10-09 NOTE — Progress Notes (Signed)
   Subjective:    Patient ID: Joshua Bouillonlem Calvin Mumaw Jr., male    DOB: 09/25/1950, 66 y.o.   MRN: [id number]  HPI At the patient's last office visit in April, he weighed 212 pounds. His fasting blood sugar was found to have increased from 97-111. His triglycerides were over 300. His HDL cholesterol was well below 40. His blood pressure remains borderline at 130/78. He is overweight for his height. Therefore he has characteristics of metabolic syndrome. Since his last office visit, the patient has lost 5 pounds through diet and exercise. He is here today to discuss further and to recheck his lab work. Past Medical History:  Diagnosis Date  . Macular degeneration    Past Surgical History:  Procedure Laterality Date  . APPENDECTOMY  1958  . ROTATOR CUFF REPAIR  07/07/2010   reattach Bicep also   Current Outpatient Prescriptions on File Prior to Visit  Medication Sig Dispense Refill  . allopurinol (ZYLOPRIM) 300 MG tablet Take 1 tablet (300 mg total) by mouth daily. 90 tablet 1  . diphenhydrAMINE (BENADRYL) 25 MG tablet Take 25 mg by mouth every 6 (six) hours as needed for allergies.    Marland Kitchen. gemfibrozil (LOPID) 600 MG tablet Take 1 tablet (600 mg total) by mouth daily. 90 tablet 1  . Multiple Vitamin (MULTIVITAMIN) tablet Take 1 tablet by mouth daily with supper.    . Multiple Vitamins-Minerals (ICAPS) CAPS Take 2 capsules by mouth 2 (two) times daily.     No current facility-administered medications on file prior to visit.    No Known Allergies Social History   Social History  . Marital status: Unknown    Spouse name: N/A  . Number of children: N/A  . Years of education: N/A   Occupational History  . Not on file.   Social History Main Topics  . Smoking status: Never Smoker  . Smokeless tobacco: Never Used  . Alcohol use No  . Drug use: No  . Sexual activity: Not Currently   Other Topics Concern  . Not on file   Social History Narrative  . No narrative on file      Review of  Systems  All other systems reviewed and are negative.      Objective:   Physical Exam  Cardiovascular: Normal rate, regular rhythm and normal heart sounds.   Pulmonary/Chest: Effort normal and breath sounds normal. No respiratory distress. He has no wheezes. He has no rales.  Vitals reviewed.         Assessment & Plan:  Metabolic syndrome - Plan: Lipid panel, COMPLETE METABOLIC PANEL WITH GFR, Hemoglobin A1c  More than 20 minutes were spent with the patient in discussion of his lab work. I recommended a low carbohydrate diet. I recommended a diet rich in vegetables. I recommended decreasing his consumption of white bread, potatoes, rice, and posterior. I recommended increasing his consumption of healthy fats such as nuts, green leafy vegetables, and increasing his aerobic exercise to 30 minutes a day 5 days a week. Also recommended that he lose 10-15 more pounds. I will recheck his lab work today including a hemoglobin A1c. Depending upon the lab work, we will determine whether or not we need to recheck labs in 6 months or if he can wait until his physical next April

## 2016-10-10 LAB — HEMOGLOBIN A1C
Hgb A1c MFr Bld: 5.7 % — ABNORMAL HIGH (ref <5.7)
MEAN PLASMA GLUCOSE: 117 mg/dL

## 2016-10-19 DIAGNOSIS — H353211 Exudative age-related macular degeneration, right eye, with active choroidal neovascularization: Secondary | ICD-10-CM | POA: Diagnosis not present

## 2016-11-05 ENCOUNTER — Other Ambulatory Visit: Payer: Self-pay | Admitting: Family Medicine

## 2017-01-07 DIAGNOSIS — H353211 Exudative age-related macular degeneration, right eye, with active choroidal neovascularization: Secondary | ICD-10-CM | POA: Diagnosis not present

## 2017-01-07 DIAGNOSIS — H353222 Exudative age-related macular degeneration, left eye, with inactive choroidal neovascularization: Secondary | ICD-10-CM | POA: Diagnosis not present

## 2017-03-12 DIAGNOSIS — H353211 Exudative age-related macular degeneration, right eye, with active choroidal neovascularization: Secondary | ICD-10-CM | POA: Diagnosis not present

## 2017-03-25 ENCOUNTER — Other Ambulatory Visit: Payer: Self-pay | Admitting: Family Medicine

## 2017-04-12 ENCOUNTER — Ambulatory Visit (INDEPENDENT_AMBULATORY_CARE_PROVIDER_SITE_OTHER): Payer: Medicare HMO | Admitting: Family Medicine

## 2017-04-12 ENCOUNTER — Encounter: Payer: Self-pay | Admitting: Family Medicine

## 2017-04-12 VITALS — BP 126/68 | HR 60 | Temp 98.3°F | Resp 16 | Ht 68.5 in | Wt 199.0 lb

## 2017-04-12 DIAGNOSIS — Z8739 Personal history of other diseases of the musculoskeletal system and connective tissue: Secondary | ICD-10-CM

## 2017-04-12 DIAGNOSIS — Z23 Encounter for immunization: Secondary | ICD-10-CM | POA: Diagnosis not present

## 2017-04-12 DIAGNOSIS — E781 Pure hyperglyceridemia: Secondary | ICD-10-CM | POA: Diagnosis not present

## 2017-04-12 NOTE — Addendum Note (Signed)
Addended by: Legrand RamsWILLIS, Rahaf Carbonell B on: 04/12/2017 08:55 AM   Modules accepted: Orders

## 2017-04-12 NOTE — Progress Notes (Signed)
Subjective:    Patient ID: Joshua Bouillonlem Calvin Kapusta Jr., male    DOB: 04/16/1950, 67 y.o.   MRN: [id number]  HPI  10/09/16 At the patient's last office visit in April, he weighed 212 pounds. His fasting blood sugar was found to have increased from 97-111. His triglycerides were over 300. His HDL cholesterol was well below 40. His blood pressure remains borderline at 130/78. He is overweight for his height. Therefore he has characteristics of metabolic syndrome. Since his last office visit, the patient has lost 5 pounds through diet and exercise. He is here today to discuss further and to recheck his lab work.  At that time, my plan was: More than 20 minutes were spent with the patient in discussion of his lab work. I recommended a low carbohydrate diet. I recommended a diet rich in vegetables. I recommended decreasing his consumption of white bread, potatoes, rice, and posterior. I recommended increasing his consumption of healthy fats such as nuts, green leafy vegetables, and increasing his aerobic exercise to 30 minutes a day 5 days a week. Also recommended that he lose 10-15 more pounds. I will recheck his lab work today including a hemoglobin A1c. Depending upon the lab work, we will determine whether or not we need to recheck labs in 6 months or if he can wait until his physical next April  04/12/17 The patient is here today for follow-up. He has lost some weight. His weight is down to 199 pounds. This is much improved from his weight in April when he was 212. I congratulated him on the weight loss. He is due today to check fasting lab work regarding his hypertriglyceridemia and metabolic syndrome. Fortunately his review of systems today is completely benign. He denies any chest pain. He denies any dyspnea on exertion. He denies any myalgias or right upper quadrant pain. He denies any cough, hemoptysis, pleurisy. He denies any fevers or chills. He is tolerating the gemfibrozil without myalgias. He is also  taking allopurinol for gout. He has not had a recent gout flare but he is due to check uric acid level. He is also due for Prevnar 13 Past Medical History:  Diagnosis Date  . Macular degeneration    Past Surgical History:  Procedure Laterality Date  . APPENDECTOMY  1958  . ROTATOR CUFF REPAIR  07/07/2010   reattach Bicep also   Current Outpatient Medications on File Prior to Visit  Medication Sig Dispense Refill  . allopurinol (ZYLOPRIM) 300 MG tablet TAKE 1 TABLET EVERY DAY 90 tablet 1  . diphenhydrAMINE (BENADRYL) 25 MG tablet Take 25 mg by mouth every 6 (six) hours as needed for allergies.    Marland Kitchen. gemfibrozil (LOPID) 600 MG tablet TAKE 1 TABLET EVERY DAY 90 tablet 1  . Multiple Vitamin (MULTIVITAMIN) tablet Take 1 tablet by mouth daily with supper.    . Multiple Vitamins-Minerals (ICAPS) CAPS Take 2 capsules by mouth 2 (two) times daily.    . Turmeric 400 MG CAPS Take by mouth.     No current facility-administered medications on file prior to visit.    No Known Allergies Social History   Socioeconomic History  . Marital status: Unknown    Spouse name: Not on file  . Number of children: Not on file  . Years of education: Not on file  . Highest education level: Not on file  Social Needs  . Financial resource strain: Not on file  . Food insecurity - worry: Not on file  . Food  insecurity - inability: Not on file  . Transportation needs - medical: Not on file  . Transportation needs - non-medical: Not on file  Occupational History  . Not on file  Tobacco Use  . Smoking status: Never Smoker  . Smokeless tobacco: Never Used  Substance and Sexual Activity  . Alcohol use: No  . Drug use: No  . Sexual activity: Not Currently  Other Topics Concern  . Not on file  Social History Narrative  . Not on file      Review of Systems  All other systems reviewed and are negative.      Objective:   Physical Exam  Cardiovascular: Normal rate, regular rhythm and normal heart  sounds.  Pulmonary/Chest: Effort normal and breath sounds normal. No respiratory distress. He has no wheezes. He has no rales.  Vitals reviewed.         Assessment & Plan:  Hypertriglyceridemia - Plan: COMPLETE METABOLIC PANEL WITH GFR, CBC with Differential/Platelet, Lipid panel  History of gout - Plan: Uric acid I'm very proud of the patient for losing weight. Particular considering [place] have [place] come [place]. I believe that we will see the fruits of his labor and his lab work. Check a fasting lipid panel today. Check a CMP. Glucose is elevated, I will add a hemoglobin A1c. I will also check uric acid level. His goal uric acid level is less than 6. To update his preventative care, he will also receive Prevnar 13. His blood pressure today is excellent.

## 2017-04-13 LAB — COMPLETE METABOLIC PANEL WITH GFR
AG RATIO: 1.8 (calc) (ref 1.0–2.5)
ALBUMIN MSPROF: 4.4 g/dL (ref 3.6–5.1)
ALKALINE PHOSPHATASE (APISO): 94 U/L (ref 40–115)
ALT: 16 U/L (ref 9–46)
AST: 20 U/L (ref 10–35)
BUN: 13 mg/dL (ref 7–25)
CO2: 31 mmol/L (ref 20–32)
Calcium: 9.5 mg/dL (ref 8.6–10.3)
Chloride: 103 mmol/L (ref 98–110)
Creat: 0.95 mg/dL (ref 0.70–1.25)
GFR, Est African American: 97 mL/min/{1.73_m2} (ref >=60)
GFR, Est Non African American: 84 mL/min/{1.73_m2} (ref >=60)
GLOBULIN: 2.5 g/dL (ref 1.9–3.7)
Glucose, Bld: 101 mg/dL — ABNORMAL HIGH (ref 65–99)
POTASSIUM: 4.6 mmol/L (ref 3.5–5.3)
SODIUM: 141 mmol/L (ref 135–146)
Total Bilirubin: 0.7 mg/dL (ref 0.2–1.2)
Total Protein: 6.9 g/dL (ref 6.1–8.1)

## 2017-04-13 LAB — LIPID PANEL
CHOL/HDL RATIO: 4.2 (calc) (ref <5.0)
CHOLESTEROL: 137 mg/dL (ref <200)
HDL: 33 mg/dL — AB (ref >40)
LDL Cholesterol (Calc): 70 mg/dL (calc)
NON-HDL CHOLESTEROL (CALC): 104 mg/dL (ref <130)
Triglycerides: 262 mg/dL — ABNORMAL HIGH (ref <150)

## 2017-04-13 LAB — CBC WITH DIFFERENTIAL/PLATELET
BASOS PCT: 0.9 %
Basophils Absolute: 72 cells/uL (ref 0–200)
EOS ABS: 296 {cells}/uL (ref 15–500)
Eosinophils Relative: 3.7 %
HEMATOCRIT: 45.1 % (ref 38.5–50.0)
HEMOGLOBIN: 15.4 g/dL (ref 13.2–17.1)
LYMPHS ABS: 1840 {cells}/uL (ref 850–3900)
MCH: 29.7 pg (ref 27.0–33.0)
MCHC: 34.1 g/dL (ref 32.0–36.0)
MCV: 87.1 fL (ref 80.0–100.0)
MPV: 9.8 fL (ref 7.5–12.5)
Monocytes Relative: 9 %
NEUTROS ABS: 5072 {cells}/uL (ref 1500–7800)
Neutrophils Relative %: 63.4 %
Platelets: 275 10*3/uL (ref 140–400)
RBC: 5.18 10*6/uL (ref 4.20–5.80)
RDW: 12.6 % (ref 11.0–15.0)
Total Lymphocyte: 23 %
WBC: 8 10*3/uL (ref 3.8–10.8)
WBCMIX: 720 {cells}/uL (ref 200–950)

## 2017-04-13 LAB — URIC ACID: Uric Acid, Serum: 5.2 mg/dL (ref 4.0–8.0)

## 2017-06-04 DIAGNOSIS — H353211 Exudative age-related macular degeneration, right eye, with active choroidal neovascularization: Secondary | ICD-10-CM | POA: Diagnosis not present

## 2017-06-06 ENCOUNTER — Ambulatory Visit (INDEPENDENT_AMBULATORY_CARE_PROVIDER_SITE_OTHER): Payer: Medicare HMO | Admitting: Family Medicine

## 2017-06-06 ENCOUNTER — Encounter: Payer: Self-pay | Admitting: Family Medicine

## 2017-06-06 VITALS — BP 118/76 | HR 80 | Temp 98.4°F | Resp 16 | Ht 68.5 in | Wt 205.0 lb

## 2017-06-06 DIAGNOSIS — H66001 Acute suppurative otitis media without spontaneous rupture of ear drum, right ear: Secondary | ICD-10-CM

## 2017-06-06 DIAGNOSIS — H9209 Otalgia, unspecified ear: Secondary | ICD-10-CM | POA: Diagnosis not present

## 2017-06-06 DIAGNOSIS — H6122 Impacted cerumen, left ear: Secondary | ICD-10-CM

## 2017-06-06 MED ORDER — AMOXICILLIN-POT CLAVULANATE 875-125 MG PO TABS
1.0000 | ORAL_TABLET | Freq: Two times a day (BID) | ORAL | 0 refills | Status: DC
Start: 2017-06-06 — End: 2018-07-21

## 2017-06-06 NOTE — Progress Notes (Signed)
Subjective:    Patient ID: Joshua Bouillonlem Calvin Poellnitz Jr., male    DOB: 12/02/1950, 67 y.o.   MRN: [id number]  Medication Refill   Patient reports bilateral ear pain for more than 1 week left greater than right.  He has been having more sinus issues lately and he believes that this may have triggered an ear infection.  He denies any tinnitus.  He denies any sinus pain.  He denies any sore throat.  On exam today, the left auditory canal is completely occluded with cerumen impaction making visualization of the left tympanic membrane and possible.  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    Past Medical History:  Diagnosis Date  . Macular degeneration    Past Surgical History:  Procedure Laterality Date  . APPENDECTOMY  1958  . ROTATOR CUFF REPAIR  07/07/2010   reattach Bicep also   Current Outpatient Medications on File Prior to Visit  Medication Sig Dispense Refill  . allopurinol (ZYLOPRIM) 300 MG tablet TAKE 1 TABLET EVERY DAY 90 tablet 1  . diphenhydrAMINE (BENADRYL) 25 MG tablet Take 25 mg by mouth every 6 (six) hours as needed for allergies.    Marland Kitchen. gemfibrozil (LOPID) 600 MG tablet TAKE 1 TABLET EVERY DAY 90 tablet 1  . Multiple Vitamin (MULTIVITAMIN) tablet Take 1 tablet by mouth daily with supper.    . Multiple Vitamins-Minerals (ICAPS) CAPS Take 2 capsules by mouth 2 (two) times daily.    . Turmeric 400 MG CAPS Take by mouth.     No current facility-administered medications on file prior to visit.    No Known Allergies Social History   Socioeconomic History  . Marital status: Unknown    Spouse name: Not on file  . Number of children: Not on file  . Years of education: Not on file  . Highest education level: Not on file  Social Needs  . Financial resource strain: Not on file  . Food insecurity - worry: Not on file  . Food insecurity - inability: Not on file  . Transportation needs - medical: Not on file  . Transportation needs - non-medical: Not on file  Occupational  History  . Not on file  Tobacco Use  . Smoking status: Never Smoker  . Smokeless tobacco: Never Used  Substance and Sexual Activity  . Alcohol use: No  . Drug use: No  . Sexual activity: Not Currently  Other Topics Concern  . Not on file  Social History Narrative  . Not on file      Review of Systems  All other systems reviewed and are negative.      Objective:   Physical Exam  HENT:  Right Ear: Tympanic membrane is erythematous and retracted.  Cardiovascular: Normal rate, regular rhythm and normal heart sounds.  Pulmonary/Chest: Effort normal and breath sounds normal. No respiratory distress. He has no wheezes. He has no rales.  Vitals reviewed. Please see history of present illness.  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 The left auditory canal is completely occluded with a hard black cerumen impaction.  We were successfully able to remove the wax from the right auditory canal revealing a erythematous retracted dull tympanic membrane suggesting otitis media.  We were unsuccessful removing the cerumen from the left ear.       Assessment & Plan:  Right otitis media, suspected left otitis media, cerumen impaction and left auditory canal.  Begin Augmentin 875 mg p.o. twice daily for 10 days to treat otitis media.  Use Murine eardrops over-the-counter for the next 3-5 days to loosen the cerumen impaction in the left auditory canal and then return to allow [place] a repeat effort at irrigation and lavage to remove the cerumen impaction

## 2017-07-08 ENCOUNTER — Other Ambulatory Visit: Payer: Medicare HMO

## 2017-07-08 DIAGNOSIS — E781 Pure hyperglyceridemia: Secondary | ICD-10-CM

## 2017-07-08 DIAGNOSIS — Z125 Encounter for screening for malignant neoplasm of prostate: Secondary | ICD-10-CM

## 2017-07-08 DIAGNOSIS — E8881 Metabolic syndrome: Secondary | ICD-10-CM

## 2017-07-08 DIAGNOSIS — Z0000 Encounter for general adult medical examination without abnormal findings: Secondary | ICD-10-CM

## 2017-07-09 LAB — COMPREHENSIVE METABOLIC PANEL
AG RATIO: 2 (calc) (ref 1.0–2.5)
ALT: 23 U/L (ref 9–46)
AST: 24 U/L (ref 10–35)
Albumin: 4.4 g/dL (ref 3.6–5.1)
Alkaline phosphatase (APISO): 93 U/L (ref 40–115)
BILIRUBIN TOTAL: 0.6 mg/dL (ref 0.2–1.2)
BUN: 14 mg/dL (ref 7–25)
CO2: 31 mmol/L (ref 20–32)
Calcium: 9.5 mg/dL (ref 8.6–10.3)
Chloride: 104 mmol/L (ref 98–110)
Creat: 0.78 mg/dL (ref 0.70–1.25)
GLUCOSE: 103 mg/dL — AB (ref 65–99)
Globulin: 2.2 g/dL (calc) (ref 1.9–3.7)
Potassium: 4.3 mmol/L (ref 3.5–5.3)
Sodium: 141 mmol/L (ref 135–146)
TOTAL PROTEIN: 6.6 g/dL (ref 6.1–8.1)

## 2017-07-09 LAB — CBC WITH DIFFERENTIAL/PLATELET
BASOS ABS: 81 {cells}/uL (ref 0–200)
Basophils Relative: 1.1 %
EOS ABS: 259 {cells}/uL (ref 15–500)
EOS PCT: 3.5 %
HEMATOCRIT: 45.9 % (ref 38.5–50.0)
HEMOGLOBIN: 15.5 g/dL (ref 13.2–17.1)
LYMPHS ABS: 1746 {cells}/uL (ref 850–3900)
MCH: 29.9 pg (ref 27.0–33.0)
MCHC: 33.8 g/dL (ref 32.0–36.0)
MCV: 88.4 fL (ref 80.0–100.0)
MONOS PCT: 8.8 %
MPV: 10 fL (ref 7.5–12.5)
NEUTROS ABS: 4662 {cells}/uL (ref 1500–7800)
Neutrophils Relative %: 63 %
Platelets: 286 10*3/uL (ref 140–400)
RBC: 5.19 10*6/uL (ref 4.20–5.80)
RDW: 12.7 % (ref 11.0–15.0)
Total Lymphocyte: 23.6 %
WBC mixed population: 651 cells/uL (ref 200–950)
WBC: 7.4 10*3/uL (ref 3.8–10.8)

## 2017-07-09 LAB — PSA: PSA: 1.2 ng/mL (ref <=4.0)

## 2017-07-09 LAB — LIPID PANEL
CHOL/HDL RATIO: 3.9 (calc) (ref <5.0)
CHOLESTEROL: 126 mg/dL (ref <200)
HDL: 32 mg/dL — AB (ref >40)
LDL CHOLESTEROL (CALC): 62 mg/dL
Non-HDL Cholesterol (Calc): 94 mg/dL (calc) (ref <130)
Triglycerides: 268 mg/dL — ABNORMAL HIGH (ref <150)

## 2017-07-15 ENCOUNTER — Ambulatory Visit (INDEPENDENT_AMBULATORY_CARE_PROVIDER_SITE_OTHER): Payer: Medicare HMO | Admitting: Family Medicine

## 2017-07-15 ENCOUNTER — Encounter: Payer: Self-pay | Admitting: Family Medicine

## 2017-07-15 VITALS — BP 130/76 | HR 74 | Temp 98.0°F | Resp 14 | Ht 68.5 in | Wt 200.0 lb

## 2017-07-15 DIAGNOSIS — E8881 Metabolic syndrome: Secondary | ICD-10-CM | POA: Diagnosis not present

## 2017-07-15 DIAGNOSIS — Z0000 Encounter for general adult medical examination without abnormal findings: Secondary | ICD-10-CM

## 2017-07-15 DIAGNOSIS — Z125 Encounter for screening for malignant neoplasm of prostate: Secondary | ICD-10-CM

## 2017-07-15 DIAGNOSIS — E781 Pure hyperglyceridemia: Secondary | ICD-10-CM | POA: Diagnosis not present

## 2017-07-15 MED ORDER — ZOSTER VAC RECOMB ADJUVANTED 50 MCG/0.5ML IM SUSR: 0.5000 mL | Freq: Once | INTRAMUSCULAR | 1 refills | Status: AC

## 2017-07-15 NOTE — Progress Notes (Signed)
Subjective:    Patient ID: Joshua Trevino., male    DOB: 1950-04-24, 67 y.o.   MRN: [id number]  HPI  Patient is a very pleasant 67 year old white male here for CPE.  Past medical history significant for macular degeneration as well as hypertriglyceridemia.  Patient's last colonoscopy was January 2015.  At that time it was recommended for repeat colonoscopy in 5 years per his gastroenterologist recommendation.  Therefore he is due at the present time.  He is also due for prostate cancer screening.  His PSA was checked and his lab work and was found to be 1.2 which is excellent.  His immunization records are listed below.  He is due for Shingrix. Immunization History  Administered Date(s) Administered  . Influenza, High Dose Seasonal PF 01/28/2017  . Influenza-Unspecified 03/13/2016  . Pneumococcal Conjugate-13 04/12/2017  . Pneumococcal Polysaccharide-23 07/09/2016  . Tdap 07/08/2015    Past Medical History:  Diagnosis Date  . Macular degeneration    Past Surgical History:  Procedure Laterality Date  . APPENDECTOMY  1958  . ROTATOR CUFF REPAIR  07/07/2010   reattach Bicep also   Current Outpatient Medications on File Prior to Visit  Medication Sig Dispense Refill  . allopurinol (ZYLOPRIM) 300 MG tablet TAKE 1 TABLET EVERY DAY 90 tablet 1  . amoxicillin-clavulanate (AUGMENTIN) 875-125 MG tablet Take 1 tablet by mouth 2 (two) times daily. 20 tablet 0  . diphenhydrAMINE (BENADRYL) 25 MG tablet Take 25 mg by mouth every 6 (six) hours as needed for allergies.    Marland Kitchen gemfibrozil (LOPID) 600 MG tablet TAKE 1 TABLET EVERY DAY 90 tablet 1  . Multiple Vitamin (MULTIVITAMIN) tablet Take 1 tablet by mouth daily with supper.    . Multiple Vitamins-Minerals (ICAPS) CAPS Take 2 capsules by mouth 2 (two) times daily.    . Turmeric 400 MG CAPS Take by mouth.     No current facility-administered medications on file prior to visit.    No Known Allergies Social History   Socioeconomic  History  . Marital status: Unknown    Spouse name: Not on file  . Number of children: Not on file  . Years of education: Not on file  . Highest education level: Not on file  Occupational History  . Not on file  Social Needs  . Financial resource strain: Not on file  . Food insecurity:    Worry: Not on file    Inability: Not on file  . Transportation needs:    Medical: Not on file    Non-medical: Not on file  Tobacco Use  . Smoking status: Never Smoker  . Smokeless tobacco: Never Used  Substance and Sexual Activity  . Alcohol use: No  . Drug use: No  . Sexual activity: Not Currently  Lifestyle  . Physical activity:    Days per week: Not on file    Minutes per session: Not on file  . Stress: Not on file  Relationships  . Social connections:    Talks on phone: Not on file    Gets together: Not on file    Attends religious service: Not on file    Active member of club or organization: Not on file    Attends meetings of clubs or organizations: Not on file    Relationship status: Not on file  . Intimate partner violence:    Fear of current or ex partner: Not on file    Emotionally abused: Not on file    Physically abused:  Not on file    Forced sexual activity: Not on file  Other Topics Concern  . Not on file  Social History Narrative  . Not on file   Family History  Problem Relation Age of Onset  . Hypertension Mother   . Macular degeneration Mother   . Cancer Father   . Macular degeneration Sister   . Early death Paternal Grandfather   . Tuberculosis Paternal Grandfather      Review of Systems  All other systems reviewed and are negative.      Objective:   Physical Exam  Constitutional: He is oriented to person, place, and time. He appears well-developed and well-nourished. No distress.  HENT:  Head: Normocephalic and atraumatic.  Right Ear: External ear normal.  Left Ear: External ear normal.  Nose: Nose normal.  Mouth/Throat: Oropharynx is clear and  moist. No oropharyngeal exudate.  Eyes: Pupils are equal, round, and reactive to light. Conjunctivae and EOM are normal. Left eye exhibits no discharge. No scleral icterus.  Neck: Normal range of motion. Neck supple. No JVD present. No tracheal deviation present. No thyromegaly present.  Cardiovascular: Normal rate, regular rhythm, normal heart sounds and intact distal pulses. Exam reveals no gallop and no friction rub.  No murmur heard. Pulmonary/Chest: Effort normal and breath sounds normal. No stridor. No respiratory distress. He has no wheezes. He has no rales. He exhibits no tenderness.  Abdominal: Soft. Bowel sounds are normal. He exhibits no distension and no mass. There is no tenderness. There is no rebound and no guarding.  Musculoskeletal: Normal range of motion. He exhibits no edema or tenderness.  Lymphadenopathy:    He has no cervical adenopathy.  Neurological: He is alert and oriented to person, place, and time. He has normal reflexes. No cranial nerve deficit. He exhibits normal muscle tone. Coordination normal.  Skin: Skin is warm. No rash noted. No erythema. No pallor.  Psychiatric: He has a normal mood and affect. His behavior is normal. Judgment and thought content normal.  Vitals reviewed.         Assessment & Plan:  Metabolic syndrome  Routine general medical examination at a health care facility  Hypertriglyceridemia  Prostate cancer screening  Physical examination is completely normal.  His most recent lab work as listed below.  It is significant for an HDL cholesterol 32, and a fasting blood sugar of 103.  Otherwise his lab work is outstanding.  His LDL cholesterol is well below 100.  HDL cholesterol is low at 32 and I have recommended daily aerobic exercise to address this.  Triglycerides are slightly elevated at 260.  Continue to encourage a low carbohydrate low saturated fat diet and increasing aerobic exercise to address this as well. Lab on 07/08/2017    Component Date Value Ref Range Status  . WBC 07/08/2017 7.4  3.8 - 10.8 Thousand/uL Final  . RBC 07/08/2017 5.19  4.20 - 5.80 Million/uL Final  . Hemoglobin 07/08/2017 15.5  13.2 - 17.1 g/dL Final  . HCT 16/01/9603 45.9  38.5 - 50.0 % Final  . MCV 07/08/2017 88.4  80.0 - 100.0 fL Final  . MCH 07/08/2017 29.9  27.0 - 33.0 pg Final  . MCHC 07/08/2017 33.8  32.0 - 36.0 g/dL Final  . RDW 54/12/8117 12.7  11.0 - 15.0 % Final  . Platelets 07/08/2017 286  140 - 400 Thousand/uL Final  . MPV 07/08/2017 10.0  7.5 - 12.5 fL Final  . Neutro Abs 07/08/2017 4,662  1,500 - 7,800  cells/uL Final  . Lymphs Abs 07/08/2017 1,746  850 - 3,900 cells/uL Final  . WBC mixed population 07/08/2017 651  200 - 950 cells/uL Final  . Eosinophils Absolute 07/08/2017 259  15 - 500 cells/uL Final  . Basophils Absolute 07/08/2017 81  0 - 200 cells/uL Final  . Neutrophils Relative % 07/08/2017 63  % Final  . Total Lymphocyte 07/08/2017 23.6  % Final  . Monocytes Relative 07/08/2017 8.8  % Final  . Eosinophils Relative 07/08/2017 3.5  % Final  . Basophils Relative 07/08/2017 1.1  % Final  . Glucose, Bld 07/08/2017 103* 65 - 99 mg/dL Final   Comment: .            Fasting reference interval . For someone without known diabetes, a glucose value between 100 and 125 mg/dL is consistent with prediabetes and should be confirmed with a follow-up test. .   . BUN 07/08/2017 14  7 - 25 mg/dL Final  . Creat 09/81/191404/04/2017 0.78  0.70 - 1.25 mg/dL Final   Comment: For patients >66 years of age, the reference limit for Creatinine is approximately 13% higher for people identified as African-American. .   Edwena Felty. BUN/Creatinine Ratio 07/08/2017 NOT APPLICABLE  6 - 22 (calc) Final  . Sodium 07/08/2017 141  135 - 146 mmol/L Final  . Potassium 07/08/2017 4.3  3.5 - 5.3 mmol/L Final  . Chloride 07/08/2017 104  98 - 110 mmol/L Final  . CO2 07/08/2017 31  20 - 32 mmol/L Final  . Calcium 07/08/2017 9.5  8.6 - 10.3 mg/dL Final  . Total  Protein 07/08/2017 6.6  6.1 - 8.1 g/dL Final  . Albumin 78/29/562104/04/2017 4.4  3.6 - 5.1 g/dL Final  . Globulin 30/86/578404/04/2017 2.2  1.9 - 3.7 g/dL (calc) Final  . AG Ratio 07/08/2017 2.0  1.0 - 2.5 (calc) Final  . Total Bilirubin 07/08/2017 0.6  0.2 - 1.2 mg/dL Final  . Alkaline phosphatase (APISO) 07/08/2017 93  40 - 115 U/L Final  . AST 07/08/2017 24  10 - 35 U/L Final  . ALT 07/08/2017 23  9 - 46 U/L Final  . Cholesterol 07/08/2017 126  <200 mg/dL Final  . HDL 69/62/952804/04/2017 32* >40 mg/dL Final  . Triglycerides 07/08/2017 268* <150 mg/dL Final  . LDL Cholesterol (Calc) 07/08/2017 62  mg/dL (calc) Final   Comment: Reference range: <100 . Desirable range <100 mg/dL for primary prevention;   <70 mg/dL for patients with CHD or diabetic patients  with > or = 2 CHD risk factors. Marland Kitchen. LDL-C is now calculated using the Martin-Hopkins  calculation, which is a validated novel method providing  better accuracy than the Friedewald equation in the  estimation of LDL-C.  Horald PollenMartin SS et al. Lenox AhrJAMA. 4132;440(102013;310(19): 2061-2068  (http://education.[facility].com/faq/FAQ164)   . Total CHOL/HDL Ratio 07/08/2017 3.9  <2.7<5.0 (calc) Final  . Non-HDL Cholesterol (Calc) 07/08/2017 94  <130 mg/dL (calc) Final   Comment: For patients with diabetes plus 1 major ASCVD risk  factor, treating to a non-HDL-C goal of <100 mg/dL  (LDL-C of <25<70 mg/dL) is considered a therapeutic  option.   Marland Kitchen. PSA 07/08/2017 1.2  < OR = 4.0 ng/mL Final   Comment: The total PSA value from this assay system is  standardized against the WHO standard. The test  result will be approximately 20% lower when compared  to the equimolar-standardized total PSA (Beckman  Coulter). Comparison of serial PSA results should be  interpreted with this fact in mind. . This test was  performed using the Siemens  chemiluminescent method. Values obtained from  different assay methods cannot be used interchangeably. PSA levels, regardless of value, should not be  interpreted as absolute evidence of the presence or absence of disease.    I recommended that the patient receive Shringrix.  He will check on the price.

## 2017-08-22 ENCOUNTER — Other Ambulatory Visit: Payer: Self-pay | Admitting: Family Medicine

## 2017-08-27 DIAGNOSIS — H353211 Exudative age-related macular degeneration, right eye, with active choroidal neovascularization: Secondary | ICD-10-CM | POA: Diagnosis not present

## 2017-11-26 DIAGNOSIS — H353211 Exudative age-related macular degeneration, right eye, with active choroidal neovascularization: Secondary | ICD-10-CM | POA: Diagnosis not present

## 2017-11-26 DIAGNOSIS — H353222 Exudative age-related macular degeneration, left eye, with inactive choroidal neovascularization: Secondary | ICD-10-CM | POA: Diagnosis not present

## 2018-01-01 DIAGNOSIS — Z800 Family history of malignant neoplasm of digestive organs: Secondary | ICD-10-CM | POA: Diagnosis not present

## 2018-02-07 DIAGNOSIS — Z1211 Encounter for screening for malignant neoplasm of colon: Secondary | ICD-10-CM | POA: Diagnosis not present

## 2018-02-07 DIAGNOSIS — Z800 Family history of malignant neoplasm of digestive organs: Secondary | ICD-10-CM | POA: Diagnosis not present

## 2018-02-07 DIAGNOSIS — K640 First degree hemorrhoids: Secondary | ICD-10-CM | POA: Diagnosis not present

## 2018-02-07 DIAGNOSIS — K5730 Diverticulosis of large intestine without perforation or abscess without bleeding: Secondary | ICD-10-CM | POA: Diagnosis not present

## 2018-03-03 DIAGNOSIS — H353211 Exudative age-related macular degeneration, right eye, with active choroidal neovascularization: Secondary | ICD-10-CM | POA: Diagnosis not present

## 2018-03-15 ENCOUNTER — Other Ambulatory Visit: Payer: Self-pay | Admitting: Family Medicine

## 2018-06-02 DIAGNOSIS — H353211 Exudative age-related macular degeneration, right eye, with active choroidal neovascularization: Secondary | ICD-10-CM | POA: Diagnosis not present

## 2018-07-11 ENCOUNTER — Other Ambulatory Visit: Payer: Medicare HMO

## 2018-07-14 ENCOUNTER — Other Ambulatory Visit: Payer: Medicare HMO

## 2018-07-14 ENCOUNTER — Other Ambulatory Visit: Payer: Self-pay

## 2018-07-14 DIAGNOSIS — E781 Pure hyperglyceridemia: Secondary | ICD-10-CM | POA: Diagnosis not present

## 2018-07-14 DIAGNOSIS — Z0000 Encounter for general adult medical examination without abnormal findings: Secondary | ICD-10-CM

## 2018-07-14 DIAGNOSIS — Z125 Encounter for screening for malignant neoplasm of prostate: Secondary | ICD-10-CM | POA: Diagnosis not present

## 2018-07-14 DIAGNOSIS — E8881 Metabolic syndrome: Secondary | ICD-10-CM | POA: Diagnosis not present

## 2018-07-15 LAB — CBC WITH DIFFERENTIAL/PLATELET
Absolute Monocytes: 689 cells/uL (ref 200–950)
Basophils Absolute: 73 cells/uL (ref 0–200)
Basophils Relative: 0.9 %
Eosinophils Absolute: 389 cells/uL (ref 15–500)
Eosinophils Relative: 4.8 %
HCT: 43.8 % (ref 38.5–50.0)
Hemoglobin: 15 g/dL (ref 13.2–17.1)
Lymphs Abs: 1750 cells/uL (ref 850–3900)
MCH: 30.4 pg (ref 27.0–33.0)
MCHC: 34.2 g/dL (ref 32.0–36.0)
MCV: 88.8 fL (ref 80.0–100.0)
MPV: 10.3 fL (ref 7.5–12.5)
Monocytes Relative: 8.5 %
Neutro Abs: 5200 cells/uL (ref 1500–7800)
Neutrophils Relative %: 64.2 %
Platelets: 300 10*3/uL (ref 140–400)
RBC: 4.93 10*6/uL (ref 4.20–5.80)
RDW: 12.8 % (ref 11.0–15.0)
Total Lymphocyte: 21.6 %
WBC: 8.1 10*3/uL (ref 3.8–10.8)

## 2018-07-15 LAB — COMPREHENSIVE METABOLIC PANEL
AG Ratio: 1.8 (calc) (ref 1.0–2.5)
ALT: 21 U/L (ref 9–46)
AST: 23 U/L (ref 10–35)
Albumin: 4.4 g/dL (ref 3.6–5.1)
Alkaline phosphatase (APISO): 93 U/L (ref 35–144)
BUN: 13 mg/dL (ref 7–25)
CO2: 29 mmol/L (ref 20–32)
Calcium: 9.3 mg/dL (ref 8.6–10.3)
Chloride: 104 mmol/L (ref 98–110)
Creat: 0.88 mg/dL (ref 0.70–1.25)
Globulin: 2.5 g/dL (calc) (ref 1.9–3.7)
Glucose, Bld: 102 mg/dL — ABNORMAL HIGH (ref 65–99)
Potassium: 4.8 mmol/L (ref 3.5–5.3)
Sodium: 140 mmol/L (ref 135–146)
Total Bilirubin: 0.4 mg/dL (ref 0.2–1.2)
Total Protein: 6.9 g/dL (ref 6.1–8.1)

## 2018-07-15 LAB — LIPID PANEL
Cholesterol: 128 mg/dL (ref <200)
HDL: 33 mg/dL — ABNORMAL LOW (ref >=40)
LDL Cholesterol (Calc): 63 mg/dL (calc)
Non-HDL Cholesterol (Calc): 95 mg/dL (calc) (ref <130)
Total CHOL/HDL Ratio: 3.9 (calc) (ref <5.0)
Triglycerides: 305 mg/dL — ABNORMAL HIGH (ref <150)

## 2018-07-15 LAB — PSA: PSA: 1.6 ng/mL (ref <=4.0)

## 2018-07-18 ENCOUNTER — Encounter: Payer: Medicare HMO | Admitting: Family Medicine

## 2018-07-21 ENCOUNTER — Encounter: Payer: Self-pay | Admitting: Family Medicine

## 2018-07-21 ENCOUNTER — Other Ambulatory Visit: Payer: Self-pay

## 2018-07-21 ENCOUNTER — Ambulatory Visit (INDEPENDENT_AMBULATORY_CARE_PROVIDER_SITE_OTHER): Payer: Medicare HMO | Admitting: Family Medicine

## 2018-07-21 VITALS — BP 126/78 | HR 88 | Temp 98.3°F | Resp 14 | Ht 68.5 in | Wt 214.0 lb

## 2018-07-21 DIAGNOSIS — Z125 Encounter for screening for malignant neoplasm of prostate: Secondary | ICD-10-CM | POA: Diagnosis not present

## 2018-07-21 DIAGNOSIS — E8881 Metabolic syndrome: Secondary | ICD-10-CM

## 2018-07-21 DIAGNOSIS — Z0001 Encounter for general adult medical examination with abnormal findings: Secondary | ICD-10-CM | POA: Diagnosis not present

## 2018-07-21 DIAGNOSIS — Z0000 Encounter for general adult medical examination without abnormal findings: Secondary | ICD-10-CM

## 2018-07-21 DIAGNOSIS — E781 Pure hyperglyceridemia: Secondary | ICD-10-CM

## 2018-07-21 NOTE — Progress Notes (Signed)
Subjective:    Patient ID: Joshua Bouillonlem Calvin Ransier Jr., male    DOB: 05/10/1950, 68 y.o.   MRN: [id number]  HPI  Patient is a very pleasant 68 year old white male here for CPE.  Past medical history significant for macular degeneration as well as hypertriglyceridemia.  Patient's last colonoscopy was January 2019.  At that time it was recommended for repeat colonoscopy in 5 years per his gastroenterologist recommendation.  Patient states that he had a colonoscopy in 2019 that was completely clear and that he was good again for 5 more years meaning that he will be due for repeat colonoscopy in 2024.  His immunization records are included below and they are up-to-date.  His most recent lab work is also shown below and is excellent except for triglycerides greater than 300 and low HDL cholesterol.. Immunization History  Administered Date(s) Administered  . Influenza, High Dose Seasonal PF 01/28/2017  . Influenza,inj,Quad PF,6+ Mos 01/13/2018  . Influenza-Unspecified 03/13/2016  . Pneumococcal Conjugate-13 04/12/2017  . Pneumococcal Polysaccharide-23 07/09/2016  . Tdap 07/08/2015  . Zoster Recombinat (Shingrix) 07/17/2017, 10/01/2017   Lab on 07/14/2018  Component Date Value Ref Range Status  . Cholesterol 07/14/2018 128  <200 mg/dL Final  . HDL 40/98/119104/09/2018 33* > OR = 40 mg/dL Final  . Triglycerides 07/14/2018 305* <150 mg/dL Final   Comment: . If a non-fasting specimen was collected, consider repeat triglyceride testing on a fasting specimen if clinically indicated.  Perry MountJacobson et al. J. of Clin. Lipidol. 2015;9:129-169. .   . LDL Cholesterol (Calc) 07/14/2018 63  mg/dL (calc) Final   Comment: Reference range: <100 . Desirable range <100 mg/dL for primary prevention;   <70 mg/dL for patients with CHD or diabetic patients  with > or = 2 CHD risk factors. Marland Kitchen. LDL-C is now calculated using the Martin-Hopkins  calculation, which is a validated novel method providing  better accuracy than the  Friedewald equation in the  estimation of LDL-C.  Horald PollenMartin SS et al. Lenox AhrJAMA. 4782;956(212013;310(19): 2061-2068  (http://education.[facility].com/faq/FAQ164)   . Total CHOL/HDL Ratio 07/14/2018 3.9  <3.0<5.0 (calc) Final  . Non-HDL Cholesterol (Calc) 07/14/2018 95  <130 mg/dL (calc) Final   Comment: For patients with diabetes plus 1 major ASCVD risk  factor, treating to a non-HDL-C goal of <100 mg/dL  (LDL-C of <86<70 mg/dL) is considered a therapeutic  option.   . Glucose, Bld 07/14/2018 102* 65 - 99 mg/dL Final   Comment: .            Fasting reference interval . For someone without known diabetes, a glucose value between 100 and 125 mg/dL is consistent with prediabetes and should be confirmed with a follow-up test. .   . BUN 07/14/2018 13  7 - 25 mg/dL Final  . Creat 57/84/696204/09/2018 0.88  0.70 - 1.25 mg/dL Final   Comment: For patients >67 years of age, the reference limit for Creatinine is approximately 13% higher for people identified as African-American. .   Edwena Felty. BUN/Creatinine Ratio 07/14/2018 NOT APPLICABLE  6 - 22 (calc) Final  . Sodium 07/14/2018 140  135 - 146 mmol/L Final  . Potassium 07/14/2018 4.8  3.5 - 5.3 mmol/L Final  . Chloride 07/14/2018 104  98 - 110 mmol/L Final  . CO2 07/14/2018 29  20 - 32 mmol/L Final  . Calcium 07/14/2018 9.3  8.6 - 10.3 mg/dL Final  . Total Protein 07/14/2018 6.9  6.1 - 8.1 g/dL Final  . Albumin 95/28/413204/09/2018 4.4  3.6 - 5.1 g/dL Final  .  Globulin 07/14/2018 2.5  1.9 - 3.7 g/dL (calc) Final  . AG Ratio 07/14/2018 1.8  1.0 - 2.5 (calc) Final  . Total Bilirubin 07/14/2018 0.4  0.2 - 1.2 mg/dL Final  . Alkaline phosphatase (APISO) 07/14/2018 93  35 - 144 U/L Final  . AST 07/14/2018 23  10 - 35 U/L Final  . ALT 07/14/2018 21  9 - 46 U/L Final  . WBC 07/14/2018 8.1  3.8 - 10.8 Thousand/uL Final  . RBC 07/14/2018 4.93  4.20 - 5.80 Million/uL Final  . Hemoglobin 07/14/2018 15.0  13.2 - 17.1 g/dL Final  . HCT 40/98/1191 43.8  38.5 - 50.0 % Final  . MCV  07/14/2018 88.8  80.0 - 100.0 fL Final  . MCH 07/14/2018 30.4  27.0 - 33.0 pg Final  . MCHC 07/14/2018 34.2  32.0 - 36.0 g/dL Final  . RDW 47/82/9562 12.8  11.0 - 15.0 % Final  . Platelets 07/14/2018 300  140 - 400 Thousand/uL Final  . MPV 07/14/2018 10.3  7.5 - 12.5 fL Final  . Neutro Abs 07/14/2018 5,200  1,500 - 7,800 cells/uL Final  . Lymphs Abs 07/14/2018 1,750  850 - 3,900 cells/uL Final  . Absolute Monocytes 07/14/2018 689  200 - 950 cells/uL Final  . Eosinophils Absolute 07/14/2018 389  15 - 500 cells/uL Final  . Basophils Absolute 07/14/2018 73  0 - 200 cells/uL Final  . Neutrophils Relative % 07/14/2018 64.2  % Final  . Total Lymphocyte 07/14/2018 21.6  % Final  . Monocytes Relative 07/14/2018 8.5  % Final  . Eosinophils Relative 07/14/2018 4.8  % Final  . Basophils Relative 07/14/2018 0.9  % Final  . PSA 07/14/2018 1.6  < OR = 4.0 ng/mL Final   Comment: The total PSA value from this assay system is  standardized against the WHO standard. The test  result will be approximately 20% lower when compared  to the equimolar-standardized total PSA (Beckman  Coulter). Comparison of serial PSA results should be  interpreted with this fact in mind. . This test was performed using the Siemens  chemiluminescent method. Values obtained from  different assay methods cannot be used interchangeably. PSA levels, regardless of value, should not be interpreted as absolute evidence of the presence or absence of disease.     Past Medical History:  Diagnosis Date  . Macular degeneration    Past Surgical History:  Procedure Laterality Date  . APPENDECTOMY  1958  . ROTATOR CUFF REPAIR  07/07/2010   reattach Bicep also   Current Outpatient Medications on File Prior to Visit  Medication Sig Dispense Refill  . allopurinol (ZYLOPRIM) 300 MG tablet TAKE 1 TABLET EVERY DAY 90 tablet 3  . diphenhydrAMINE (BENADRYL) 25 MG tablet Take 25 mg by mouth every 6 (six) hours as needed for allergies.     Marland Kitchen gemfibrozil (LOPID) 600 MG tablet TAKE 1 TABLET EVERY DAY 90 tablet 1  . Multiple Vitamin (MULTIVITAMIN) tablet Take 1 tablet by mouth daily with supper.    . Multiple Vitamins-Minerals (ICAPS) CAPS Take 2 capsules by mouth 2 (two) times daily.    . Turmeric 400 MG CAPS Take by mouth.     No current facility-administered medications on file prior to visit.    No Known Allergies Social History   Socioeconomic History  . Marital status: Unknown    Spouse name: Not on file  . Number of children: Not on file  . Years of education: Not on file  . Highest education  level: Not on file  Occupational History  . Not on file  Social Needs  . Financial resource strain: Not on file  . Food insecurity:    Worry: Not on file    Inability: Not on file  . Transportation needs:    Medical: Not on file    Non-medical: Not on file  Tobacco Use  . Smoking status: Never Smoker  . Smokeless tobacco: Never Used  Substance and Sexual Activity  . Alcohol use: No  . Drug use: No  . Sexual activity: Not Currently  Lifestyle  . Physical activity:    Days per week: Not on file    Minutes per session: Not on file  . Stress: Not on file  Relationships  . Social connections:    Talks on phone: Not on file    Gets together: Not on file    Attends religious service: Not on file    Active member of club or organization: Not on file    Attends meetings of clubs or organizations: Not on file    Relationship status: Not on file  . Intimate partner violence:    Fear of current or ex partner: Not on file    Emotionally abused: Not on file    Physically abused: Not on file    Forced sexual activity: Not on file  Other Topics Concern  . Not on file  Social History Narrative  . Not on file   Family History  Problem Relation Age of Onset  . Hypertension Mother   . Macular degeneration Mother   . Cancer Father   . Macular degeneration Sister   . Early death Paternal Grandfather   .  Tuberculosis Paternal Grandfather      Review of Systems  All other systems reviewed and are negative.      Objective:   Physical Exam  Constitutional: He is oriented to person, place, and time. He appears well-developed and well-nourished. No distress.  HENT:  Head: Normocephalic and atraumatic.  Right Ear: External ear normal.  Left Ear: External ear normal.  Nose: Nose normal.  Mouth/Throat: Oropharynx is clear and moist. No oropharyngeal exudate.  Eyes: Pupils are equal, round, and reactive to light. Conjunctivae and EOM are normal. Left eye exhibits no discharge. No scleral icterus.  Neck: Normal range of motion. Neck supple. No JVD present. No tracheal deviation present. No thyromegaly present.  Cardiovascular: Normal rate, regular rhythm, normal heart sounds and intact distal pulses. Exam reveals no gallop and no friction rub.  No murmur heard. Pulmonary/Chest: Effort normal and breath sounds normal. No stridor. No respiratory distress. He has no wheezes. He has no rales. He exhibits no tenderness.  Abdominal: Soft. Bowel sounds are normal. He exhibits no distension and no mass. There is no abdominal tenderness. There is no rebound and no guarding.  Musculoskeletal: Normal range of motion.        General: No tenderness or edema.  Lymphadenopathy:    He has no cervical adenopathy.  Neurological: He is alert and oriented to person, place, and time. He has normal reflexes. No cranial nerve deficit. He exhibits normal muscle tone. Coordination normal.  Skin: Skin is warm. No rash noted. No erythema. No pallor.  Psychiatric: He has a normal mood and affect. His behavior is normal. Judgment and thought content normal.  Vitals reviewed.         Assessment & Plan:  Routine general medical examination at a health care facility  Metabolic syndrome  Hypertriglyceridemia  Prostate cancer screening  Continue to encourage the patient to try to lose weight.  I like to see him  under 200 pounds through a combination of diet and exercise.  Patient's blood pressure is excellent.  PSA is within normal limits at 1.6.  Colonoscopy is up-to-date.  Immunizations are up-to-date.  I did encourage the patient to add fish oil 2000 mg a day to his gemfibrozil due to his elevated triglycerides greater than 300 in addition to working on his diet.  Patient denies any issues with falls or depression.  I reviewed his activities of daily living and the patient denies any problems performing basic ADLs.  His questionnaire is pan negative for any issues.

## 2018-09-08 DIAGNOSIS — H353222 Exudative age-related macular degeneration, left eye, with inactive choroidal neovascularization: Secondary | ICD-10-CM | POA: Diagnosis not present

## 2018-09-08 DIAGNOSIS — H353211 Exudative age-related macular degeneration, right eye, with active choroidal neovascularization: Secondary | ICD-10-CM | POA: Diagnosis not present

## 2018-09-10 ENCOUNTER — Other Ambulatory Visit: Payer: Self-pay | Admitting: Family Medicine

## 2018-10-13 ENCOUNTER — Encounter: Payer: Self-pay | Admitting: Family Medicine

## 2018-10-13 ENCOUNTER — Ambulatory Visit (INDEPENDENT_AMBULATORY_CARE_PROVIDER_SITE_OTHER): Payer: Medicare HMO | Admitting: Family Medicine

## 2018-10-13 ENCOUNTER — Other Ambulatory Visit: Payer: Self-pay

## 2018-10-13 VITALS — BP 110/64 | HR 82 | Temp 99.1°F | Resp 16 | Ht 68.5 in | Wt 211.0 lb

## 2018-10-13 DIAGNOSIS — H6123 Impacted cerumen, bilateral: Secondary | ICD-10-CM | POA: Diagnosis not present

## 2018-10-13 NOTE — Progress Notes (Signed)
Subjective:    Patient ID: Joshua Trevino., male    DOB: Jan 09, 1951, 68 y.o.   MRN: [id number]  Medication Refill  Patient reports bilateral ear pain for several weeks.  He states that over the last week he has developed a constant pressure-like pain in his left ear more so than in his right ear.  He also reports ringing in his left ear and right ear.  He denies any head congestion or rhinorrhea or sore throat or sinus pain.  He denies any fevers or chills.  He denies any dizziness.  On examination, left auditory canal is 100% occluded with impacted cerumen.  Right auditory canal is 90% occluded with impacted cerumen Past Medical History:  Diagnosis Date  . Macular degeneration    Past Surgical History:  Procedure Laterality Date  . APPENDECTOMY  1958  . ROTATOR CUFF REPAIR  07/07/2010   reattach Bicep also   Current Outpatient Medications on File Prior to Visit  Medication Sig Dispense Refill  . allopurinol (ZYLOPRIM) 300 MG tablet TAKE 1 TABLET EVERY DAY 90 tablet 3  . diphenhydrAMINE (BENADRYL) 25 MG tablet Take 25 mg by mouth every 6 (six) hours as needed for allergies.    Marland Kitchen gemfibrozil (LOPID) 600 MG tablet TAKE 1 TABLET EVERY DAY 90 tablet 1  . Multiple Vitamin (MULTIVITAMIN) tablet Take 1 tablet by mouth daily with supper.    . Multiple Vitamins-Minerals (ICAPS) CAPS Take 2 capsules by mouth 2 (two) times daily.    . Turmeric 400 MG CAPS Take by mouth.     No current facility-administered medications on file prior to visit.    No Known Allergies Social History   Socioeconomic History  . Marital status: Unknown    Spouse name: Not on file  . Number of children: Not on file  . Years of education: Not on file  . Highest education level: Not on file  Occupational History  . Not on file  Social Needs  . Financial resource strain: Not on file  . Food insecurity    Worry: Not on file    Inability: Not on file  . Transportation needs    Medical: Not on file   Non-medical: Not on file  Tobacco Use  . Smoking status: Never Smoker  . Smokeless tobacco: Never Used  Substance and Sexual Activity  . Alcohol use: No  . Drug use: No  . Sexual activity: Not Currently  Lifestyle  . Physical activity    Days per week: Not on file    Minutes per session: Not on file  . Stress: Not on file  Relationships  . Social Herbalist on phone: Not on file    Gets together: Not on file    Attends religious service: Not on file    Active member of club or organization: Not on file    Attends meetings of clubs or organizations: Not on file    Relationship status: Not on file  . Intimate partner violence    Fear of current or ex partner: Not on file    Emotionally abused: Not on file    Physically abused: Not on file    Forced sexual activity: Not on file  Other Topics Concern  . Not on file  Social History Narrative  . Not on file      Review of Systems  All other systems reviewed and are negative.      Objective:   Physical Exam  HENT:  Right Ear: Tympanic membrane is erythematous and retracted.  Cardiovascular: Normal rate, regular rhythm and normal heart sounds.  Pulmonary/Chest: Effort normal and breath sounds normal. No respiratory distress. He has no wheezes. He has no rales.  Vitals reviewed. Please see history of present illness.  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 The left auditory canal is completely occluded with a hard black cerumen impaction.        Assessment & Plan:  The encounter diagnosis was Bilateral hearing loss due to cerumen impaction.  Patient has bilateral cerumen impactions that were easily removed with irrigation and lavage.  Patient tolerated the procedure well without complication.  Symptoms improved once the wax was removed from his auditory canals.

## 2018-12-08 DIAGNOSIS — H353211 Exudative age-related macular degeneration, right eye, with active choroidal neovascularization: Secondary | ICD-10-CM | POA: Diagnosis not present

## 2018-12-08 DIAGNOSIS — H353222 Exudative age-related macular degeneration, left eye, with inactive choroidal neovascularization: Secondary | ICD-10-CM | POA: Diagnosis not present

## 2018-12-31 ENCOUNTER — Other Ambulatory Visit: Payer: Self-pay | Admitting: Family Medicine

## 2019-02-13 ENCOUNTER — Other Ambulatory Visit: Payer: Self-pay | Admitting: Family Medicine

## 2019-03-10 ENCOUNTER — Other Ambulatory Visit: Payer: Self-pay | Admitting: Family Medicine

## 2019-03-10 DIAGNOSIS — H353211 Exudative age-related macular degeneration, right eye, with active choroidal neovascularization: Secondary | ICD-10-CM | POA: Diagnosis not present

## 2019-06-16 DIAGNOSIS — H353211 Exudative age-related macular degeneration, right eye, with active choroidal neovascularization: Secondary | ICD-10-CM | POA: Diagnosis not present

## 2019-06-16 DIAGNOSIS — H353222 Exudative age-related macular degeneration, left eye, with inactive choroidal neovascularization: Secondary | ICD-10-CM | POA: Diagnosis not present

## 2019-06-29 ENCOUNTER — Other Ambulatory Visit: Payer: Self-pay | Admitting: Family Medicine

## 2019-07-24 ENCOUNTER — Encounter: Payer: Medicare HMO | Admitting: Family Medicine

## 2019-08-03 ENCOUNTER — Other Ambulatory Visit: Payer: Self-pay

## 2019-08-03 ENCOUNTER — Other Ambulatory Visit: Payer: Medicare HMO

## 2019-08-03 DIAGNOSIS — Z0000 Encounter for general adult medical examination without abnormal findings: Secondary | ICD-10-CM | POA: Diagnosis not present

## 2019-08-03 DIAGNOSIS — Z136 Encounter for screening for cardiovascular disorders: Secondary | ICD-10-CM | POA: Diagnosis not present

## 2019-08-03 DIAGNOSIS — Z125 Encounter for screening for malignant neoplasm of prostate: Secondary | ICD-10-CM | POA: Diagnosis not present

## 2019-08-04 LAB — CBC WITH DIFFERENTIAL/PLATELET
Absolute Monocytes: 672 cells/uL (ref 200–950)
Basophils Absolute: 37 cells/uL (ref 0–200)
Basophils Relative: 0.4 %
Eosinophils Absolute: 294 cells/uL (ref 15–500)
Eosinophils Relative: 3.2 %
HCT: 44.8 % (ref 38.5–50.0)
Hemoglobin: 14.8 g/dL (ref 13.2–17.1)
Lymphs Abs: 1628 cells/uL (ref 850–3900)
MCH: 30 pg (ref 27.0–33.0)
MCHC: 33 g/dL (ref 32.0–36.0)
MCV: 90.7 fL (ref 80.0–100.0)
MPV: 9.9 fL (ref 7.5–12.5)
Monocytes Relative: 7.3 %
Neutro Abs: 6569 cells/uL (ref 1500–7800)
Neutrophils Relative %: 71.4 %
Platelets: 306 10*3/uL (ref 140–400)
RBC: 4.94 10*6/uL (ref 4.20–5.80)
RDW: 12.7 % (ref 11.0–15.0)
Total Lymphocyte: 17.7 %
WBC: 9.2 10*3/uL (ref 3.8–10.8)

## 2019-08-04 LAB — LIPID PANEL
Cholesterol: 125 mg/dL (ref <200)
HDL: 36 mg/dL — ABNORMAL LOW (ref >=40)
LDL Cholesterol (Calc): 60 mg/dL (calc)
Non-HDL Cholesterol (Calc): 89 mg/dL (calc) (ref <130)
Total CHOL/HDL Ratio: 3.5 (calc) (ref <5.0)
Triglycerides: 229 mg/dL — ABNORMAL HIGH (ref <150)

## 2019-08-04 LAB — COMPREHENSIVE METABOLIC PANEL
AG Ratio: 1.7 (calc) (ref 1.0–2.5)
ALT: 26 U/L (ref 9–46)
AST: 28 U/L (ref 10–35)
Albumin: 4.3 g/dL (ref 3.6–5.1)
Alkaline phosphatase (APISO): 105 U/L (ref 35–144)
BUN: 13 mg/dL (ref 7–25)
CO2: 28 mmol/L (ref 20–32)
Calcium: 9.6 mg/dL (ref 8.6–10.3)
Chloride: 102 mmol/L (ref 98–110)
Creat: 0.91 mg/dL (ref 0.70–1.25)
Globulin: 2.5 g/dL (calc) (ref 1.9–3.7)
Glucose, Bld: 107 mg/dL — ABNORMAL HIGH (ref 65–99)
Potassium: 4.4 mmol/L (ref 3.5–5.3)
Sodium: 141 mmol/L (ref 135–146)
Total Bilirubin: 0.7 mg/dL (ref 0.2–1.2)
Total Protein: 6.8 g/dL (ref 6.1–8.1)

## 2019-08-04 LAB — PSA: PSA: 1.5 ng/mL (ref <=4.0)

## 2019-08-10 ENCOUNTER — Encounter: Payer: Self-pay | Admitting: Family Medicine

## 2019-08-10 ENCOUNTER — Other Ambulatory Visit: Payer: Self-pay

## 2019-08-10 ENCOUNTER — Ambulatory Visit (INDEPENDENT_AMBULATORY_CARE_PROVIDER_SITE_OTHER): Payer: Medicare HMO | Admitting: Family Medicine

## 2019-08-10 VITALS — BP 142/90 | HR 78 | Temp 97.2°F | Resp 14 | Ht 68.5 in | Wt 213.0 lb

## 2019-08-10 DIAGNOSIS — H3530 Unspecified macular degeneration: Secondary | ICD-10-CM | POA: Diagnosis not present

## 2019-08-10 DIAGNOSIS — Z125 Encounter for screening for malignant neoplasm of prostate: Secondary | ICD-10-CM | POA: Diagnosis not present

## 2019-08-10 DIAGNOSIS — E8881 Metabolic syndrome: Secondary | ICD-10-CM

## 2019-08-10 DIAGNOSIS — E781 Pure hyperglyceridemia: Secondary | ICD-10-CM

## 2019-08-10 DIAGNOSIS — Z0001 Encounter for general adult medical examination with abnormal findings: Secondary | ICD-10-CM

## 2019-08-10 DIAGNOSIS — Z0000 Encounter for general adult medical examination without abnormal findings: Secondary | ICD-10-CM

## 2019-08-10 NOTE — Progress Notes (Signed)
Subjective:    Patient ID: Joshua Trevino., male    DOB: 12-27-1950, 69 y.o.   MRN: [id number]  HPI  Patient is a very pleasant 69 year old white male here for CPE.  Past medical history significant for macular degeneration as well as hypertriglyceridemia.  Patient's last colonoscopy was January 2019.  At that time it was recommended for repeat colonoscopy in 5 years per his gastroenterologist recommendation.  Patient states that he had a colonoscopy in 2019 that was completely clear and that he was good again for 5 more years meaning that he will be due for repeat colonoscopy in 2024.  His immunization records are included below and they are up-to-date.  Immunization History  Administered Date(s) Administered  . Influenza, High Dose Seasonal PF 01/28/2017  . Influenza,inj,Quad PF,6+ Mos 01/13/2018, 01/26/2019  . Influenza-Unspecified 03/13/2016  . Pneumococcal Conjugate-13 04/12/2017, 01/26/2019  . Pneumococcal Polysaccharide-23 07/09/2016  . Tdap 07/08/2015  . Zoster Recombinat (Shingrix) 07/16/2017, 10/01/2017   Lab on 08/03/2019  Component Date Value Ref Range Status  . Cholesterol 08/03/2019 125  <200 mg/dL Final  . HDL 08/03/2019 36* > OR = 40 mg/dL Final  . Triglycerides 08/03/2019 229* <150 mg/dL Final   Comment: . If a non-fasting specimen was collected, consider repeat triglyceride testing on a fasting specimen if clinically indicated.  Yates Decamp et al. J. of Clin. Lipidol. 4098;1:191-478. .   . LDL Cholesterol (Calc) 08/03/2019 60  mg/dL (calc) Final   Comment: Reference range: <100 . Desirable range <100 mg/dL for primary prevention;   <70 mg/dL for patients with CHD or diabetic patients  with > or = 2 CHD risk factors. Marland Kitchen LDL-C is now calculated using the Martin-Hopkins  calculation, which is a validated novel method providing  better accuracy than the Friedewald equation in the  estimation of LDL-C.  Cresenciano Genre et al. Annamaria Helling. 2956;213(08): 2061-2068   (http://education.[facility].com/faq/FAQ164)   . Total CHOL/HDL Ratio 08/03/2019 3.5  <5.0 (calc) Final  . Non-HDL Cholesterol (Calc) 08/03/2019 89  <130 mg/dL (calc) Final   Comment: For patients with diabetes plus 1 major ASCVD risk  factor, treating to a non-HDL-C goal of <100 mg/dL  (LDL-C of <70 mg/dL) is considered a therapeutic  option.   . Glucose, Bld 08/03/2019 107* 65 - 99 mg/dL Final   Comment: .            Fasting reference interval . For someone without known diabetes, a glucose value between 100 and 125 mg/dL is consistent with prediabetes and should be confirmed with a follow-up test. .   . BUN 08/03/2019 13  7 - 25 mg/dL Final  . Creat 08/03/2019 0.91  0.70 - 1.25 mg/dL Final   Comment: For patients >12 years of age, the reference limit for Creatinine is approximately 13% higher for people identified as African-American. .   Havery Moros Ratio 65/78/4696 NOT APPLICABLE  6 - 22 (calc) Final  . Sodium 08/03/2019 141  135 - 146 mmol/L Final  . Potassium 08/03/2019 4.4  3.5 - 5.3 mmol/L Final  . Chloride 08/03/2019 102  98 - 110 mmol/L Final  . CO2 08/03/2019 28  20 - 32 mmol/L Final  . Calcium 08/03/2019 9.6  8.6 - 10.3 mg/dL Final  . Total Protein 08/03/2019 6.8  6.1 - 8.1 g/dL Final  . Albumin 08/03/2019 4.3  3.6 - 5.1 g/dL Final  . Globulin 08/03/2019 2.5  1.9 - 3.7 g/dL (calc) Final  . AG Ratio 08/03/2019 1.7  1.0 - 2.5 (  calc) Final  . Total Bilirubin 08/03/2019 0.7  0.2 - 1.2 mg/dL Final  . Alkaline phosphatase (APISO) 08/03/2019 105  35 - 144 U/L Final  . AST 08/03/2019 28  10 - 35 U/L Final  . ALT 08/03/2019 26  9 - 46 U/L Final  . WBC 08/03/2019 9.2  3.8 - 10.8 Thousand/uL Final  . RBC 08/03/2019 4.94  4.20 - 5.80 Million/uL Final  . Hemoglobin 08/03/2019 14.8  13.2 - 17.1 g/dL Final  . HCT 56/21/3086 44.8  38.5 - 50.0 % Final  . MCV 08/03/2019 90.7  80.0 - 100.0 fL Final  . MCH 08/03/2019 30.0  27.0 - 33.0 pg Final  . MCHC 08/03/2019  33.0  32.0 - 36.0 g/dL Final  . RDW 57/84/6962 12.7  11.0 - 15.0 % Final  . Platelets 08/03/2019 306  140 - 400 Thousand/uL Final  . MPV 08/03/2019 9.9  7.5 - 12.5 fL Final  . Neutro Abs 08/03/2019 6,569  1,500 - 7,800 cells/uL Final  . Lymphs Abs 08/03/2019 1,628  850 - 3,900 cells/uL Final  . Absolute Monocytes 08/03/2019 672  200 - 950 cells/uL Final  . Eosinophils Absolute 08/03/2019 294  15 - 500 cells/uL Final  . Basophils Absolute 08/03/2019 37  0 - 200 cells/uL Final  . Neutrophils Relative % 08/03/2019 71.4  % Final  . Total Lymphocyte 08/03/2019 17.7  % Final  . Monocytes Relative 08/03/2019 7.3  % Final  . Eosinophils Relative 08/03/2019 3.2  % Final  . Basophils Relative 08/03/2019 0.4  % Final  . PSA 08/03/2019 1.5  < OR = 4.0 ng/mL Final   Comment: The total PSA value from this assay system is  standardized against the WHO standard. The test  result will be approximately 20% lower when compared  to the equimolar-standardized total PSA (Beckman  Coulter). Comparison of serial PSA results should be  interpreted with this fact in mind. . This test was performed using the Siemens  chemiluminescent method. Values obtained from  different assay methods cannot be used interchangeably. PSA levels, regardless of value, should not be interpreted as absolute evidence of the presence or absence of disease.      Past Medical History:  Diagnosis Date  . Macular degeneration    Past Surgical History:  Procedure Laterality Date  . APPENDECTOMY  1958  . ROTATOR CUFF REPAIR  07/07/2010   reattach Bicep also   Current Outpatient Medications on File Prior to Visit  Medication Sig Dispense Refill  . allopurinol (ZYLOPRIM) 300 MG tablet TAKE 1 TABLET EVERY DAY 90 tablet 2  . diphenhydrAMINE (BENADRYL) 25 MG tablet Take 25 mg by mouth every 6 (six) hours as needed for allergies.    Marland Kitchen gemfibrozil (LOPID) 600 MG tablet TAKE 1 TABLET EVERY DAY 90 tablet 1  . Multiple Vitamin  (MULTIVITAMIN) tablet Take 1 tablet by mouth daily with supper.    . Multiple Vitamins-Minerals (ICAPS) CAPS Take 2 capsules by mouth 2 (two) times daily.    . Turmeric 400 MG CAPS Take by mouth.     No current facility-administered medications on file prior to visit.   No Known Allergies Social History   Socioeconomic History  . Marital status: Unknown    Spouse name: Not on file  . Number of children: Not on file  . Years of education: Not on file  . Highest education level: Not on file  Occupational History  . Not on file  Tobacco Use  . Smoking status: Never  Smoker  . Smokeless tobacco: Never Used  Substance and Sexual Activity  . Alcohol use: No  . Drug use: No  . Sexual activity: Not Currently  Other Topics Concern  . Not on file  Social History Narrative  . Not on file   Social Determinants of Health   Financial Resource Strain:   . Difficulty of Paying Living Expenses:   Food Insecurity:   . Worried About Programme researcher, broadcasting/film/video in the Last Year:   . Barista in the Last Year:   Transportation Needs:   . Freight forwarder (Medical):   Marland Kitchen Lack of Transportation (Non-Medical):   Physical Activity:   . Days of Exercise per Week:   . Minutes of Exercise per Session:   Stress:   . Feeling of Stress :   Social Connections:   . Frequency of Communication with Friends and Family:   . Frequency of Social Gatherings with Friends and Family:   . Attends Religious Services:   . Active Member of Clubs or Organizations:   . Attends Banker Meetings:   Marland Kitchen Marital Status:   Intimate Partner Violence:   . Fear of Current or Ex-Partner:   . Emotionally Abused:   Marland Kitchen Physically Abused:   . Sexually Abused:    Family History  Problem Relation Age of Onset  . Hypertension Mother   . Macular degeneration Mother   . Cancer Father   . Macular degeneration Sister   . Early death Paternal Grandfather   . Tuberculosis Paternal Grandfather      Review  of Systems  All other systems reviewed and are negative.      Objective:   Physical Exam  Constitutional: He is oriented to person, place, and time. He appears well-developed and well-nourished. No distress.  HENT:  Head: Normocephalic and atraumatic.  Right Ear: External ear normal.  Left Ear: External ear normal.  Nose: Nose normal.  Mouth/Throat: Oropharynx is clear and moist. No oropharyngeal exudate.  Eyes: Pupils are equal, round, and reactive to light. Conjunctivae and EOM are normal. Left eye exhibits no discharge. No scleral icterus.  Neck: No JVD present. No tracheal deviation present. No thyromegaly present.  Cardiovascular: Normal rate, regular rhythm, normal heart sounds and intact distal pulses. Exam reveals no gallop and no friction rub.  No murmur heard. Pulmonary/Chest: Effort normal and breath sounds normal. No stridor. No respiratory distress. He has no wheezes. He has no rales. He exhibits no tenderness.  Abdominal: Soft. Bowel sounds are normal. He exhibits no distension and no mass. There is no abdominal tenderness. There is no rebound and no guarding.  Musculoskeletal:        General: No tenderness or edema. Normal range of motion.     Cervical back: Normal range of motion and neck supple.  Lymphadenopathy:    He has no cervical adenopathy.  Neurological: He is alert and oriented to person, place, and time. He has normal reflexes. No cranial nerve deficit. He exhibits normal muscle tone. Coordination normal.  Skin: Skin is warm. No rash noted. No erythema. No pallor.  Psychiatric: He has a normal mood and affect. His behavior is normal. Judgment and thought content normal.  Vitals reviewed.         Assessment & Plan:  Routine general medical examination at a health care facility  Metabolic syndrome  Hypertriglyceridemia  Prostate cancer screening  Macular degeneration of both eyes, unspecified type  Patient denies any falls.  He  denies any memory  loss.  He denies any depression.  Blood pressure today is 2 points elevated.  Blood sugar is elevated at 107.  Triglycerides are elevated in the 220s.  HDL cholesterol is low in the 30s.  All this qualifies for metabolic syndrome.  I would not add additional medication however I strongly encouraged the patient to try to lose 10 pounds over the next 6 months.  I recommended a low carbohydrate diet.  I recommended 30 minutes a day 5 days a week of aerobic exercise.  He is not smoking.  He does check his blood pressure at home and he states usually at home within the 120s to 130s over 80-90.  PSA is within normal limits.  Colonoscopy is up-to-date.  I strongly recommended Covid vaccination.  I spent 5 minutes explaining the safety and rationale behind Covid vaccinations to the patient.

## 2019-09-22 DIAGNOSIS — H353211 Exudative age-related macular degeneration, right eye, with active choroidal neovascularization: Secondary | ICD-10-CM | POA: Diagnosis not present

## 2019-10-10 ENCOUNTER — Other Ambulatory Visit: Payer: Self-pay | Admitting: Family Medicine

## 2019-11-21 ENCOUNTER — Other Ambulatory Visit: Payer: Self-pay | Admitting: Family Medicine

## 2019-12-29 DIAGNOSIS — H353211 Exudative age-related macular degeneration, right eye, with active choroidal neovascularization: Secondary | ICD-10-CM | POA: Diagnosis not present

## 2020-02-12 ENCOUNTER — Other Ambulatory Visit: Payer: Self-pay

## 2020-02-12 ENCOUNTER — Ambulatory Visit (INDEPENDENT_AMBULATORY_CARE_PROVIDER_SITE_OTHER): Payer: Medicare HMO | Admitting: Family Medicine

## 2020-02-12 VITALS — BP 120/80 | HR 62 | Temp 97.8°F | Ht 68.0 in | Wt 209.0 lb

## 2020-02-12 DIAGNOSIS — J069 Acute upper respiratory infection, unspecified: Secondary | ICD-10-CM | POA: Diagnosis not present

## 2020-02-12 MED ORDER — AMOXICILLIN 875 MG PO TABS: 875.0000 mg | ORAL_TABLET | Freq: Two times a day (BID) | ORAL | 0 refills | Status: DC

## 2020-02-12 NOTE — Progress Notes (Signed)
Subjective:    Patient ID: Joshua Trevino., male    DOB: 1950/12/15, 69 y.o.   MRN: [id number]  Symptoms began last weekend and the first of this week.  Symptoms include pressure in his sinuses, pressure in both ears, slight disequilibrium, low-grade fever, and fatigue.  He denies any cough.  He denies any sore throat.  He denies any shortness of breath.  He denies any body aches.  Fever has subsided.  He denies any nausea or vomiting or diarrhea.  He denies any abdominal pain.  He denies any dysuria.  He denies any rashes. Past Medical History:  Diagnosis Date  . Macular degeneration    Past Surgical History:  Procedure Laterality Date  . APPENDECTOMY  1958  . ROTATOR CUFF REPAIR  07/07/2010   reattach Bicep also   Current Outpatient Medications on File Prior to Visit  Medication Sig Dispense Refill  . allopurinol (ZYLOPRIM) 300 MG tablet TAKE 1 TABLET EVERY DAY 90 tablet 2  . diphenhydrAMINE (BENADRYL) 25 MG tablet Take 25 mg by mouth every 6 (six) hours as needed for allergies.    Marland Kitchen gemfibrozil (LOPID) 600 MG tablet TAKE 1 TABLET EVERY DAY 90 tablet 1  . Multiple Vitamin (MULTIVITAMIN) tablet Take 1 tablet by mouth daily with supper.    . Multiple Vitamins-Minerals (ICAPS) CAPS Take 2 capsules by mouth 2 (two) times daily.    . Turmeric 400 MG CAPS Take by mouth.     No current facility-administered medications on file prior to visit.   No Known Allergies Social History   Socioeconomic History  . Marital status: Unknown    Spouse name: Not on file  . Number of children: Not on file  . Years of education: Not on file  . Highest education level: Not on file  Occupational History  . Not on file  Tobacco Use  . Smoking status: Never Smoker  . Smokeless tobacco: Never Used  Substance and Sexual Activity  . Alcohol use: No  . Drug use: No  . Sexual activity: Not Currently  Other Topics Concern  . Not on file  Social History Narrative  . Not on file   Social  Determinants of Health   Financial Resource Strain:   . Difficulty of Paying Living Expenses: Not on file  Food Insecurity:   . Worried About Programme researcher, broadcasting/film/video in the Last Year: Not on file  . Ran Out of Food in the Last Year: Not on file  Transportation Needs:   . Lack of Transportation (Medical): Not on file  . Lack of Transportation (Non-Medical): Not on file  Physical Activity:   . Days of Exercise per Week: Not on file  . Minutes of Exercise per Session: Not on file  Stress:   . Feeling of Stress : Not on file  Social Connections:   . Frequency of Communication with Friends and Family: Not on file  . Frequency of Social Gatherings with Friends and Family: Not on file  . Attends Religious Services: Not on file  . Active Member of Clubs or Organizations: Not on file  . Attends Banker Meetings: Not on file  . Marital Status: Not on file  Intimate Partner Violence:   . Fear of Current or Ex-Partner: Not on file  . Emotionally Abused: Not on file  . Physically Abused: Not on file  . Sexually Abused: Not on file      Review of Systems  All other systems reviewed  and are negative.      Objective:   Physical Exam Vitals reviewed.  Constitutional:      Appearance: Normal appearance. He is normal weight.  HENT:     Right Ear: There is impacted cerumen.     Left Ear: Tympanic membrane and ear canal normal.     Nose: Rhinorrhea present. No congestion.     Mouth/Throat:     Mouth: Mucous membranes are moist.     Pharynx: Oropharynx is clear. No oropharyngeal exudate.  Eyes:     Conjunctiva/sclera: Conjunctivae normal.  Cardiovascular:     Rate and Rhythm: Normal rate and regular rhythm.     Heart sounds: Normal heart sounds.  Pulmonary:     Effort: Pulmonary effort is normal. No respiratory distress.     Breath sounds: Normal breath sounds. No wheezing or rales.  Neurological:     Mental Status: He is alert.         Assessment & Plan:  Viral  upper respiratory tract infection  I believe the patient has a viral upper respiratory infection/viral sinus infection.  Recommended tincture of time.  Anticipate symptoms will gradually improve over the next 2 to 3 days on their own.  If symptoms worsen over the weekend especially if he develops high fever, sinus pain, I would begin amoxicillin for a secondary sinus infection.  However anticipate that his symptoms will improve on their own without further treatment.  He has had his Covid vaccine.  Symptoms are mild.

## 2020-04-05 DIAGNOSIS — H353211 Exudative age-related macular degeneration, right eye, with active choroidal neovascularization: Secondary | ICD-10-CM | POA: Diagnosis not present

## 2020-04-06 ENCOUNTER — Other Ambulatory Visit: Payer: Self-pay | Admitting: Family Medicine

## 2020-06-01 ENCOUNTER — Other Ambulatory Visit: Payer: Self-pay | Admitting: Family Medicine

## 2020-07-12 DIAGNOSIS — H353211 Exudative age-related macular degeneration, right eye, with active choroidal neovascularization: Secondary | ICD-10-CM | POA: Diagnosis not present

## 2020-08-05 ENCOUNTER — Other Ambulatory Visit: Payer: Self-pay

## 2020-08-05 ENCOUNTER — Other Ambulatory Visit: Payer: Medicare HMO

## 2020-08-05 DIAGNOSIS — E781 Pure hyperglyceridemia: Secondary | ICD-10-CM

## 2020-08-05 DIAGNOSIS — Z136 Encounter for screening for cardiovascular disorders: Secondary | ICD-10-CM | POA: Diagnosis not present

## 2020-08-05 DIAGNOSIS — Z0000 Encounter for general adult medical examination without abnormal findings: Secondary | ICD-10-CM

## 2020-08-05 DIAGNOSIS — E8881 Metabolic syndrome: Secondary | ICD-10-CM

## 2020-08-05 DIAGNOSIS — Z125 Encounter for screening for malignant neoplasm of prostate: Secondary | ICD-10-CM | POA: Diagnosis not present

## 2020-08-05 DIAGNOSIS — Z13220 Encounter for screening for lipoid disorders: Secondary | ICD-10-CM | POA: Diagnosis not present

## 2020-08-06 LAB — COMPLETE METABOLIC PANEL WITH GFR
AG Ratio: 1.9 (calc) (ref 1.0–2.5)
ALT: 25 U/L (ref 9–46)
AST: 30 U/L (ref 10–35)
Albumin: 4.8 g/dL (ref 3.6–5.1)
Alkaline phosphatase (APISO): 93 U/L (ref 35–144)
BUN: 20 mg/dL (ref 7–25)
CO2: 22 mmol/L (ref 20–32)
Calcium: 9.7 mg/dL (ref 8.6–10.3)
Chloride: 102 mmol/L (ref 98–110)
Creat: 0.95 mg/dL (ref 0.70–1.25)
GFR, Est African American: 94 mL/min/{1.73_m2} (ref >=60)
GFR, Est Non African American: 81 mL/min/{1.73_m2} (ref >=60)
Globulin: 2.5 g/dL (calc) (ref 1.9–3.7)
Glucose, Bld: 89 mg/dL (ref 65–99)
Potassium: 4.3 mmol/L (ref 3.5–5.3)
Sodium: 140 mmol/L (ref 135–146)
Total Bilirubin: 0.7 mg/dL (ref 0.2–1.2)
Total Protein: 7.3 g/dL (ref 6.1–8.1)

## 2020-08-06 LAB — CBC WITH DIFFERENTIAL/PLATELET
Absolute Monocytes: 685 cells/uL (ref 200–950)
Basophils Absolute: 62 cells/uL (ref 0–200)
Basophils Relative: 0.7 %
Eosinophils Absolute: 258 cells/uL (ref 15–500)
Eosinophils Relative: 2.9 %
HCT: 46.7 % (ref 38.5–50.0)
Hemoglobin: 15.6 g/dL (ref 13.2–17.1)
Lymphs Abs: 1985 cells/uL (ref 850–3900)
MCH: 30 pg (ref 27.0–33.0)
MCHC: 33.4 g/dL (ref 32.0–36.0)
MCV: 89.8 fL (ref 80.0–100.0)
MPV: 10.3 fL (ref 7.5–12.5)
Monocytes Relative: 7.7 %
Neutro Abs: 5910 cells/uL (ref 1500–7800)
Neutrophils Relative %: 66.4 %
Platelets: 275 10*3/uL (ref 140–400)
RBC: 5.2 10*6/uL (ref 4.20–5.80)
RDW: 13.1 % (ref 11.0–15.0)
Total Lymphocyte: 22.3 %
WBC: 8.9 10*3/uL (ref 3.8–10.8)

## 2020-08-06 LAB — LIPID PANEL
Cholesterol: 143 mg/dL (ref <200)
HDL: 40 mg/dL (ref >=40)
LDL Cholesterol (Calc): 64 mg/dL (calc)
Non-HDL Cholesterol (Calc): 103 mg/dL (calc) (ref <130)
Total CHOL/HDL Ratio: 3.6 (calc) (ref <5.0)
Triglycerides: 320 mg/dL — ABNORMAL HIGH (ref <150)

## 2020-08-06 LAB — PSA: PSA: 1.35 ng/mL (ref <=4.00)

## 2020-08-12 ENCOUNTER — Encounter: Payer: Self-pay | Admitting: Family Medicine

## 2020-08-12 ENCOUNTER — Ambulatory Visit (INDEPENDENT_AMBULATORY_CARE_PROVIDER_SITE_OTHER): Payer: Medicare HMO | Admitting: Family Medicine

## 2020-08-12 ENCOUNTER — Other Ambulatory Visit: Payer: Self-pay

## 2020-08-12 VITALS — BP 138/74 | HR 86 | Temp 97.8°F | Resp 14 | Ht 68.0 in | Wt 207.0 lb

## 2020-08-12 DIAGNOSIS — Z0000 Encounter for general adult medical examination without abnormal findings: Secondary | ICD-10-CM

## 2020-08-12 DIAGNOSIS — Z125 Encounter for screening for malignant neoplasm of prostate: Secondary | ICD-10-CM

## 2020-08-12 DIAGNOSIS — E8881 Metabolic syndrome: Secondary | ICD-10-CM

## 2020-08-12 DIAGNOSIS — E781 Pure hyperglyceridemia: Secondary | ICD-10-CM | POA: Diagnosis not present

## 2020-08-12 DIAGNOSIS — H3530 Unspecified macular degeneration: Secondary | ICD-10-CM

## 2020-08-12 DIAGNOSIS — Z0001 Encounter for general adult medical examination with abnormal findings: Secondary | ICD-10-CM | POA: Diagnosis not present

## 2020-08-12 NOTE — Progress Notes (Signed)
Subjective:    Patient ID: Joshua Trevino., male    DOB: [birth year], 70 y.o.   MRN: [id number]  HPI  Patient is a very pleasant 70 year old white male here for CPE.  Past medical history significant for gout as well as hypertriglyceridemia.  Patient's last colonoscopy was January 2019.  At that time it was recommended for repeat colonoscopy in 5 years per his gastroenterologist recommendation.  Patient states that he had a colonoscopy in 2019 that was completely clear and that he was good again for 5 more years meaning that he will be due for repeat colonoscopy in 2024.  He has a family history of colon cancer in his father.  His most recent PSA was stable compared to the last 3 years.  He denies any lower urinary tract symptoms.  He is due for a booster on his COVID-vaccine although he is hesitant to want to receive the.  Otherwise he is doing well. Immunization History  Administered Date(s) Administered  . Influenza, High Dose Seasonal PF 01/28/2017  . Influenza,inj,Quad PF,6+ Mos 01/13/2018, 01/26/2019  . Influenza-Unspecified 03/13/2016  . Moderna Sars-Covid-2 Vaccination 11/02/2019, 12/02/2019  . Pneumococcal Conjugate-13 04/12/2017, 01/26/2019  . Pneumococcal Polysaccharide-23 07/09/2016  . Tdap 07/08/2015  . Zoster Recombinat (Shingrix) 07/16/2017, 10/01/2017   Lab on 08/05/2020  Component Date Value Ref Range Status  . WBC 08/05/2020 8.9  3.8 - 10.8 Thousand/uL Final  . RBC 08/05/2020 5.20  4.20 - 5.80 Million/uL Final  . Hemoglobin 08/05/2020 15.6  13.2 - 17.1 g/dL Final  . HCT 40/81/4481 46.7  38.5 - 50.0 % Final  . MCV 08/05/2020 89.8  80.0 - 100.0 fL Final  . MCH 08/05/2020 30.0  27.0 - 33.0 pg Final  . MCHC 08/05/2020 33.4  32.0 - 36.0 g/dL Final  . RDW 85/63/1497 13.1  11.0 - 15.0 % Final  . Platelets 08/05/2020 275  140 - 400 Thousand/uL Final  . MPV 08/05/2020 10.3  7.5 - 12.5 fL Final  . Neutro Abs 08/05/2020 5,910  1,500 - 7,800 cells/uL Final  . Lymphs Abs  08/05/2020 1,985  850 - 3,900 cells/uL Final  . Absolute Monocytes 08/05/2020 685  200 - 950 cells/uL Final  . Eosinophils Absolute 08/05/2020 258  15 - 500 cells/uL Final  . Basophils Absolute 08/05/2020 62  0 - 200 cells/uL Final  . Neutrophils Relative % 08/05/2020 66.4  % Final  . Total Lymphocyte 08/05/2020 22.3  % Final  . Monocytes Relative 08/05/2020 7.7  % Final  . Eosinophils Relative 08/05/2020 2.9  % Final  . Basophils Relative 08/05/2020 0.7  % Final  . Glucose, Bld 08/05/2020 89  65 - 99 mg/dL Final   Comment: .            Fasting reference interval .   . BUN 08/05/2020 20  7 - 25 mg/dL Final  . Creat 02/63/7858 0.95  0.70 - 1.25 mg/dL Final   Comment: For patients >17 years of age, the reference limit for Creatinine is approximately 13% higher for people identified as African-American. .   . GFR, Est Non African American 08/05/2020 81  > OR = 60 mL/min/1.57m2 Final  . GFR, Est African American 08/05/2020 94  > OR = 60 mL/min/1.29m2 Final  . BUN/Creatinine Ratio 08/05/2020 NOT APPLICABLE  6 - 22 (calc) Final  . Sodium 08/05/2020 140  135 - 146 mmol/L Final  . Potassium 08/05/2020 4.3  3.5 - 5.3 mmol/L Final  . Chloride 08/05/2020 102  98 -  110 mmol/L Final  . CO2 08/05/2020 22  20 - 32 mmol/L Final  . Calcium 08/05/2020 9.7  8.6 - 10.3 mg/dL Final  . Total Protein 08/05/2020 7.3  6.1 - 8.1 g/dL Final  . Albumin 38/75/6433 4.8  3.6 - 5.1 g/dL Final  . Globulin 29/51/8841 2.5  1.9 - 3.7 g/dL (calc) Final  . AG Ratio 08/05/2020 1.9  1.0 - 2.5 (calc) Final  . Total Bilirubin 08/05/2020 0.7  0.2 - 1.2 mg/dL Final  . Alkaline phosphatase (APISO) 08/05/2020 93  35 - 144 U/L Final  . AST 08/05/2020 30  10 - 35 U/L Final  . ALT 08/05/2020 25  9 - 46 U/L Final  . Cholesterol 08/05/2020 143  <200 mg/dL Final  . HDL 66/09/3014 40  > OR = 40 mg/dL Final  . Triglycerides 08/05/2020 320* <150 mg/dL Final   Comment: . If a non-fasting specimen was collected, consider repeat  triglyceride testing on a fasting specimen if clinically indicated.  Perry Mount et al. J. of Clin. Lipidol. 2015;9:129-169. .   . LDL Cholesterol (Calc) 08/05/2020 64  mg/dL (calc) Final   Comment: Reference range: <100 . Desirable range <100 mg/dL for primary prevention;   <70 mg/dL for patients with CHD or diabetic patients  with > or = 2 CHD risk factors. Marland Kitchen LDL-C is now calculated using the Martin-Hopkins  calculation, which is a validated novel method providing  better accuracy than the Friedewald equation in the  estimation of LDL-C.  Horald Pollen et al. Lenox Ahr. 0109;323(55): 2061-2068  (http://education.[facility].com/faq/FAQ164)   . Total CHOL/HDL Ratio 08/05/2020 3.6  <7.3 (calc) Final  . Non-HDL Cholesterol (Calc) 08/05/2020 103  <130 mg/dL (calc) Final   Comment: For patients with diabetes plus 1 major ASCVD risk  factor, treating to a non-HDL-C goal of <100 mg/dL  (LDL-C of <22 mg/dL) is considered a therapeutic  option.   Marland Kitchen PSA 08/05/2020 1.35  < OR = 4.00 ng/mL Final   Comment: The total PSA value from this assay system is  standardized against the WHO standard. The test  result will be approximately 20% lower when compared  to the equimolar-standardized total PSA (Beckman  Coulter). Comparison of serial PSA results should be  interpreted with this fact in mind. . This test was performed using the Siemens  chemiluminescent method. Values obtained from  different assay methods cannot be used interchangeably. PSA levels, regardless of value, should not be interpreted as absolute evidence of the presence or absence of disease.      Past Medical History:  Diagnosis Date  . Macular degeneration    Past Surgical History:  Procedure Laterality Date  . APPENDECTOMY  1958  . ROTATOR CUFF REPAIR  07/07/2010   reattach Bicep also   Current Outpatient Medications on File Prior to Visit  Medication Sig Dispense Refill  . allopurinol (ZYLOPRIM) 300 MG tablet TAKE  1 TABLET EVERY DAY 90 tablet 2  . diphenhydrAMINE (BENADRYL) 25 MG tablet Take 25 mg by mouth every 6 (six) hours as needed for allergies.    Marland Kitchen gemfibrozil (LOPID) 600 MG tablet TAKE 1 TABLET EVERY DAY 90 tablet 1  . Multiple Vitamin (MULTIVITAMIN) tablet Take 1 tablet by mouth daily with supper.    . Multiple Vitamins-Minerals (ICAPS) CAPS Take 2 capsules by mouth 2 (two) times daily.    Marland Kitchen OVER THE COUNTER MEDICATION Collengula    . OVER THE COUNTER MEDICATION Blueberry    . Turmeric 400 MG CAPS Take by mouth.  No current facility-administered medications on file prior to visit.   No Known Allergies Social History   Socioeconomic History  . Marital status: Unknown    Spouse name: Not on file  . Number of children: Not on file  . Years of education: Not on file  . Highest education level: Not on file  Occupational History  . Not on file  Tobacco Use  . Smoking status: Never Smoker  . Smokeless tobacco: Never Used  Substance and Sexual Activity  . Alcohol use: No  . Drug use: No  . Sexual activity: Not Currently  Other Topics Concern  . Not on file  Social History Narrative  . Not on file   Social Determinants of Health   Financial Resource Strain: Not on file  Food Insecurity: Not on file  Transportation Needs: Not on file  Physical Activity: Not on file  Stress: Not on file  Social Connections: Not on file  Intimate Partner Violence: Not on file   Family History  Problem Relation Age of Onset  . Hypertension Mother   . Macular degeneration Mother   . Cancer Father   . Macular degeneration Sister   . Early death Paternal Grandfather   . Tuberculosis Paternal Grandfather      Review of Systems  All other systems reviewed and are negative.      Objective:   Physical Exam Vitals reviewed.  Constitutional:      General: He is not in acute distress.    Appearance: He is well-developed.  HENT:     Head: Normocephalic and atraumatic.     Right Ear:  External ear normal.     Left Ear: External ear normal.     Nose: Nose normal.     Mouth/Throat:     Pharynx: No oropharyngeal exudate.  Eyes:     General: No scleral icterus.       Left eye: No discharge.     Conjunctiva/sclera: Conjunctivae normal.     Pupils: Pupils are equal, round, and reactive to light.  Neck:     Thyroid: No thyromegaly.     Vascular: No JVD.     Trachea: No tracheal deviation.  Cardiovascular:     Rate and Rhythm: Normal rate and regular rhythm.     Heart sounds: Normal heart sounds. No murmur heard. No friction rub. No gallop.   Pulmonary:     Effort: Pulmonary effort is normal. No respiratory distress.     Breath sounds: Normal breath sounds. No stridor. No wheezing or rales.  Chest:     Chest wall: No tenderness.  Abdominal:     General: Bowel sounds are normal. There is no distension.     Palpations: Abdomen is soft. There is no mass.     Tenderness: There is no abdominal tenderness. There is no guarding or rebound.  Musculoskeletal:        General: No tenderness. Normal range of motion.     Cervical back: Normal range of motion and neck supple.  Lymphadenopathy:     Cervical: No cervical adenopathy.  Skin:    General: Skin is warm.     Coloration: Skin is not pale.     Findings: No erythema or rash.  Neurological:     Mental Status: He is alert and oriented to person, place, and time.     Cranial Nerves: No cranial nerve deficit.     Motor: No abnormal muscle tone.     Coordination: Coordination normal.  Deep Tendon Reflexes: Reflexes are normal and symmetric.  Psychiatric:        Behavior: Behavior normal.        Thought Content: Thought content normal.        Judgment: Judgment normal.           Assessment & Plan:  Routine general medical examination at a health care facility  Metabolic syndrome  Hypertriglyceridemia  Prostate cancer screening  Macular degeneration of both eyes, unspecified type   Patient denies any  falls.  He denies any memory loss.  He denies any depression.  Colonoscopy is up-to-date.  Prostate cancer screening is up-to-date.  I did recommend a booster on his COVID-vaccine.  His blood sugar is much better compared to last year.  His HDL cholesterol is also better.  He continues to have elevated triglyceride levels despite taking 2000 mg a day of fish oil and taking gemfibrozil.  We discussed switching to Vascepa but due to cost, the patient would prefer to try to increase his fish oil to 4000 mg.  However overall he is doing well.  Did recommend 10 to 15 pounds of weight loss gradually

## 2020-09-05 ENCOUNTER — Other Ambulatory Visit: Payer: Self-pay | Admitting: Family Medicine

## 2020-09-19 DIAGNOSIS — H353211 Exudative age-related macular degeneration, right eye, with active choroidal neovascularization: Secondary | ICD-10-CM | POA: Diagnosis not present

## 2021-01-02 DIAGNOSIS — H353211 Exudative age-related macular degeneration, right eye, with active choroidal neovascularization: Secondary | ICD-10-CM | POA: Diagnosis not present

## 2021-01-04 ENCOUNTER — Other Ambulatory Visit: Payer: Self-pay | Admitting: Family Medicine

## 2021-04-05 ENCOUNTER — Encounter: Payer: Self-pay | Admitting: Nurse Practitioner

## 2021-04-05 ENCOUNTER — Other Ambulatory Visit: Payer: Self-pay

## 2021-04-05 ENCOUNTER — Telehealth (INDEPENDENT_AMBULATORY_CARE_PROVIDER_SITE_OTHER): Payer: Medicare HMO | Admitting: Nurse Practitioner

## 2021-04-05 VITALS — Temp 98.4°F

## 2021-04-05 DIAGNOSIS — J069 Acute upper respiratory infection, unspecified: Secondary | ICD-10-CM

## 2021-04-05 NOTE — Progress Notes (Signed)
Subjective:    Patient ID: Joshua Trevino., male    DOB: 12-Aug-1950, 70 y.o.   MRN: [id number]  HPI: [name]. is a 70 y.o. male presenting virtually for sore throat.  Chief Complaint  Patient presents with   Sore Throat   UPPER RESPIRATORY TRACT INFECTION Onset: Saturday  COVID-19 testing history: tested negative last week, has done testing Fever: no Body aches: no Chills: yes Cough:  yes; productive and clear Shortness of breath: no Wheezing: no Chest pain: yes, with cough Chest tightness: no Chest congestion: no Nasal congestion: yes Runny nose: yes Post nasal drip: yes Sneezing: yes Sore throat: yes Swollen glands: yes Sinus pressure: no Headache: no Face pain: no Toothache: no Ear pain: yes; bilateral Ear pressure: no  Eyes red/itching:yes Eye drainage/crusting: yes  Nausea: no  Vomiting: no Diarrhea: no  Change in appetite: no  Loss of taste/smell: no  Rash: no Fatigue: yes Sick contacts: yes; wife tested positive last week for COVID-19 Strep contacts: no  Context: better Recurrent sinusitis: no Treatments attempted: cough drops, sinus medication, aspirin, neti pot Relief with OTC medications: yes  No Known Allergies  Outpatient Encounter Medications as of 04/05/2021  Medication Sig   allopurinol (ZYLOPRIM) 300 MG tablet TAKE 1 TABLET EVERY DAY   diphenhydrAMINE (BENADRYL) 25 MG tablet Take 25 mg by mouth every 6 (six) hours as needed for allergies.   gemfibrozil (LOPID) 600 MG tablet TAKE 1 TABLET EVERY DAY   Multiple Vitamin (MULTIVITAMIN) tablet Take 1 tablet by mouth daily with supper.   Multiple Vitamins-Minerals (ICAPS) CAPS Take 2 capsules by mouth 2 (two) times daily.   OVER THE COUNTER MEDICATION Collengula   OVER THE COUNTER MEDICATION Blueberry   Turmeric 400 MG CAPS Take by mouth.   No facility-administered encounter medications on file as of 04/05/2021.    Patient Active Problem List   Diagnosis Date Noted    Macular degeneration     Past Medical History:  Diagnosis Date   Macular degeneration     Relevant past medical, surgical, family and social history reviewed and updated as indicated. Interim medical history since our last visit reviewed.  Review of Systems Per HPI unless specifically indicated above     Objective:    Temp 98.4 F (36.9 C) (Oral)   Wt Readings from Last 3 Encounters:  08/12/20 207 lb (93.9 kg)  02/12/20 209 lb (94.8 kg)  08/10/19 213 lb (96.6 kg)    Physical Exam Nursing note reviewed.  Constitutional:      General: He is not in acute distress.    Appearance: Normal appearance. He is not toxic-appearing.  HENT:     Head: Normocephalic and atraumatic.     Left Ear: External ear normal.     Nose: Nose normal.     Mouth/Throat:     Mouth: Mucous membranes are moist.     Pharynx: Oropharynx is clear.  Eyes:     General: No scleral icterus.       Right eye: No discharge.        Left eye: No discharge.     Comments: Under eyes puffy  Cardiovascular:     Comments: Unable to assess heart sounds via virtual visit Pulmonary:     Comments: Unable to assess lung sounds via virtual visit.  Patient talking in complete sentences during telemedicine visit.  No accessory muscle use. Skin:    Coloration: Skin is not jaundiced or pale.  Findings: No erythema.  Neurological:     Mental Status: He is alert and oriented to person, place, and time.      Assessment & Plan:  1. Viral upper respiratory tract infection Acute.  Encouraged viral testing including repeat COVID testing given symptoms starting 5 days ago.  Discussed timeframe for antiviral if COVID-positive-last day for maximum effectiveness would be today.  Reassured patient that symptoms and exam findings are most consistent with a viral upper respiratory infection and explained lack of efficacy of antibiotics against viruses.  Discussed expected course and features suggestive of secondary bacterial  infection.  Continue supportive care. Increase fluid intake with water or electrolyte solution like pedialyte. Encouraged acetaminophen as needed for fever/pain. Encouraged salt water gargling, chloraseptic spray and throat lozenges. Encouraged OTC guaifenesin. Encouraged saline sinus flushes and/or neti with humidified air.  Can start Flonase/Claritin to help with congestion.  Follow-up if symptoms worsen or do not improve within 7 to 10 days.    Follow up plan: Return if symptoms worsen or fail to improve.    Due to the catastrophic nature of the COVID-19 pandemic, this video visit was completed soley via audio and visual contact via Caregility due to the restrictions of the COVID-19 pandemic.  All issues as above were discussed and addressed. Physical exam was done as above through visual confirmation on Caregility. If it was felt that the patient should be evaluated in the office, they were directed there. The patient verbally consented to this visit. Location of the patient: home Location of the provider: work Those involved with this call:  Provider: Cathlean Marseilles, DNP, FNP-C CMA: Darrol Angel, CMA Front Desk/Registration: Percival Spanish  Time spent on call:  15 minutes with patient face to face via video conference. More than 50% of this time was spent in counseling and coordination of care. 20 minutes total spent in review of patient's record and preparation of their chart. I verified patient identity using two factors (patient name and date of birth). Patient consents verbally to being seen via telemedicine visit today.

## 2021-04-17 DIAGNOSIS — H353211 Exudative age-related macular degeneration, right eye, with active choroidal neovascularization: Secondary | ICD-10-CM | POA: Diagnosis not present

## 2021-05-01 ENCOUNTER — Other Ambulatory Visit: Payer: Self-pay | Admitting: Family Medicine

## 2021-07-31 DIAGNOSIS — H353211 Exudative age-related macular degeneration, right eye, with active choroidal neovascularization: Secondary | ICD-10-CM | POA: Diagnosis not present

## 2021-08-08 ENCOUNTER — Other Ambulatory Visit: Payer: Medicare HMO

## 2021-08-08 ENCOUNTER — Other Ambulatory Visit: Payer: Self-pay

## 2021-08-08 DIAGNOSIS — E781 Pure hyperglyceridemia: Secondary | ICD-10-CM | POA: Diagnosis not present

## 2021-08-08 DIAGNOSIS — Z0000 Encounter for general adult medical examination without abnormal findings: Secondary | ICD-10-CM

## 2021-08-08 DIAGNOSIS — Z125 Encounter for screening for malignant neoplasm of prostate: Secondary | ICD-10-CM | POA: Diagnosis not present

## 2021-08-08 DIAGNOSIS — E8881 Metabolic syndrome: Secondary | ICD-10-CM | POA: Diagnosis not present

## 2021-08-09 LAB — LIPID PANEL
Cholesterol: 129 mg/dL (ref <200)
HDL: 38 mg/dL — ABNORMAL LOW (ref >=40)
LDL Cholesterol (Calc): 54 mg/dL (calc)
Non-HDL Cholesterol (Calc): 91 mg/dL (calc) (ref <130)
Total CHOL/HDL Ratio: 3.4 (calc) (ref <5.0)
Triglycerides: 350 mg/dL — ABNORMAL HIGH (ref <150)

## 2021-08-09 LAB — COMPREHENSIVE METABOLIC PANEL
AG Ratio: 1.9 (calc) (ref 1.0–2.5)
ALT: 27 U/L (ref 9–46)
AST: 26 U/L (ref 10–35)
Albumin: 4.6 g/dL (ref 3.6–5.1)
Alkaline phosphatase (APISO): 86 U/L (ref 35–144)
BUN: 15 mg/dL (ref 7–25)
CO2: 28 mmol/L (ref 20–32)
Calcium: 9.3 mg/dL (ref 8.6–10.3)
Chloride: 102 mmol/L (ref 98–110)
Creat: 0.79 mg/dL (ref 0.70–1.28)
Globulin: 2.4 g/dL (calc) (ref 1.9–3.7)
Glucose, Bld: 105 mg/dL — ABNORMAL HIGH (ref 65–99)
Potassium: 4.2 mmol/L (ref 3.5–5.3)
Sodium: 140 mmol/L (ref 135–146)
Total Bilirubin: 0.5 mg/dL (ref 0.2–1.2)
Total Protein: 7 g/dL (ref 6.1–8.1)

## 2021-08-09 LAB — CBC WITH DIFFERENTIAL/PLATELET
Absolute Monocytes: 676 cells/uL (ref 200–950)
Basophils Absolute: 61 cells/uL (ref 0–200)
Basophils Relative: 0.8 %
Eosinophils Absolute: 289 cells/uL (ref 15–500)
Eosinophils Relative: 3.8 %
HCT: 45.7 % (ref 38.5–50.0)
Hemoglobin: 15.4 g/dL (ref 13.2–17.1)
Lymphs Abs: 1900 cells/uL (ref 850–3900)
MCH: 30.3 pg (ref 27.0–33.0)
MCHC: 33.7 g/dL (ref 32.0–36.0)
MCV: 89.8 fL (ref 80.0–100.0)
MPV: 10.1 fL (ref 7.5–12.5)
Monocytes Relative: 8.9 %
Neutro Abs: 4674 cells/uL (ref 1500–7800)
Neutrophils Relative %: 61.5 %
Platelets: 294 10*3/uL (ref 140–400)
RBC: 5.09 10*6/uL (ref 4.20–5.80)
RDW: 12.8 % (ref 11.0–15.0)
Total Lymphocyte: 25 %
WBC: 7.6 10*3/uL (ref 3.8–10.8)

## 2021-08-09 LAB — PSA: PSA: 1.24 ng/mL (ref <=4.00)

## 2021-08-14 ENCOUNTER — Telehealth: Payer: Self-pay | Admitting: Family Medicine

## 2021-08-14 ENCOUNTER — Other Ambulatory Visit: Payer: Self-pay

## 2021-08-14 ENCOUNTER — Other Ambulatory Visit: Payer: Self-pay | Admitting: Family Medicine

## 2021-08-14 ENCOUNTER — Encounter: Payer: Self-pay | Admitting: Family Medicine

## 2021-08-14 ENCOUNTER — Ambulatory Visit (INDEPENDENT_AMBULATORY_CARE_PROVIDER_SITE_OTHER): Payer: Medicare HMO | Admitting: Family Medicine

## 2021-08-14 VITALS — BP 140/72 | HR 71 | Temp 98.1°F | Ht 68.0 in | Wt 217.0 lb

## 2021-08-14 DIAGNOSIS — Z0000 Encounter for general adult medical examination without abnormal findings: Secondary | ICD-10-CM | POA: Diagnosis not present

## 2021-08-14 DIAGNOSIS — E8881 Metabolic syndrome: Secondary | ICD-10-CM

## 2021-08-14 DIAGNOSIS — Z125 Encounter for screening for malignant neoplasm of prostate: Secondary | ICD-10-CM

## 2021-08-14 DIAGNOSIS — E781 Pure hyperglyceridemia: Secondary | ICD-10-CM

## 2021-08-14 MED ORDER — SPIRULINA 500 MG PO TABS
500.0000 mg | ORAL_TABLET | Freq: Once | ORAL | 0 refills | Status: AC
Start: 2021-08-14 — End: 2021-08-14

## 2021-08-14 MED ORDER — ICOSAPENT ETHYL 1 G PO CAPS: 2.0000 g | ORAL_CAPSULE | Freq: Two times a day (BID) | ORAL | 5 refills | Status: DC

## 2021-08-14 NOTE — Telephone Encounter (Signed)
Patient called to let you know that he takes spirulina supplement 500 mg and he takes 2 tablets a day. ? ?CB# [phone] ?

## 2021-08-14 NOTE — Progress Notes (Signed)
? ?Subjective:  ? ? Patient ID: Joshua Hacker., male    DOB: 05-Jun-1950, 71 y.o.   MRN: [id number] ? ?HPI ? ?Patient is a very pleasant 71 year old white male here for CPE.  Patient has a past medical history of gout, hypertriglyceridemia, dyslipidemia, mild elevations in his blood sugar and obesity.  He qualifies for metabolic syndrome.  He denies any falls or depression or memory loss.  His immunizations are up-to-date as demonstrated below.  His most recent lab work is listed below ?Immunization History  ?Administered Date(s) Administered  ? Influenza, High Dose Seasonal PF 01/28/2017  ? Influenza,inj,Quad PF,6+ Mos 01/13/2018, 01/26/2019  ? Influenza-Unspecified 03/13/2016  ? Moderna Sars-Covid-2 Vaccination 11/02/2019, 12/02/2019  ? Pneumococcal Conjugate-13 04/12/2017, 01/26/2019  ? Pneumococcal Polysaccharide-23 07/09/2016  ? Tdap 07/08/2015  ? Zoster Recombinat (Shingrix) 07/16/2017, 10/01/2017  ? ? ? ?Orders Only on 08/08/2021  ?Component Date Value Ref Range Status  ? PSA 08/08/2021 1.24  < OR = 4.00 ng/mL Final  ? Comment: The total PSA value from this assay system is  ?standardized against the WHO standard. The test  ?result will be approximately 20% lower when compared  ?to the equimolar-standardized total PSA (Beckman  ?Coulter). Comparison of serial PSA results should be  ?interpreted with this fact in mind. ?. ?This test was performed using the Siemens  ?chemiluminescent method. Values obtained from  ?different assay methods cannot be used ?interchangeably. PSA levels, regardless of ?value, should not be interpreted as absolute ?evidence of the presence or absence of disease. ?  ?Lab on 08/08/2021  ?Component Date Value Ref Range Status  ? Glucose, Bld 08/08/2021 105 (H)  65 - 99 mg/dL Final  ? Comment: . ?           Fasting reference interval ?. ?For someone without known diabetes, a glucose value ?between 100 and 125 mg/dL is consistent with ?prediabetes and should be confirmed with  a ?follow-up test. ?. ?  ? BUN 08/08/2021 15  7 - 25 mg/dL Final  ? Creat 08/08/2021 0.79  0.70 - 1.28 mg/dL Final  ? BUN/Creatinine Ratio [id number] NOT APPLICABLE  6 - 22 (calc) Final  ? Sodium 08/08/2021 140  135 - 146 mmol/L Final  ? Potassium 08/08/2021 4.2  3.5 - 5.3 mmol/L Final  ? Chloride 08/08/2021 102  98 - 110 mmol/L Final  ? CO2 08/08/2021 28  20 - 32 mmol/L Final  ? Calcium 08/08/2021 9.3  8.6 - 10.3 mg/dL Final  ? Total Protein 08/08/2021 7.0  6.1 - 8.1 g/dL Final  ? Albumin 08/08/2021 4.6  3.6 - 5.1 g/dL Final  ? Globulin 08/08/2021 2.4  1.9 - 3.7 g/dL (calc) Final  ? AG Ratio 08/08/2021 1.9  1.0 - 2.5 (calc) Final  ? Total Bilirubin 08/08/2021 0.5  0.2 - 1.2 mg/dL Final  ? Alkaline phosphatase (APISO) 08/08/2021 86  35 - 144 U/L Final  ? AST 08/08/2021 26  10 - 35 U/L Final  ? ALT 08/08/2021 27  9 - 46 U/L Final  ? WBC 08/08/2021 7.6  3.8 - 10.8 Thousand/uL Final  ? RBC 08/08/2021 5.09  4.20 - 5.80 Million/uL Final  ? Hemoglobin 08/08/2021 15.4  13.2 - 17.1 g/dL Final  ? HCT 08/08/2021 45.7  38.5 - 50.0 % Final  ? MCV 08/08/2021 89.8  80.0 - 100.0 fL Final  ? MCH 08/08/2021 30.3  27.0 - 33.0 pg Final  ? MCHC 08/08/2021 33.7  32.0 - 36.0 g/dL Final  ? RDW 08/08/2021  12.8  11.0 - 15.0 % Final  ? Platelets 08/08/2021 294  140 - 400 Thousand/uL Final  ? MPV 08/08/2021 10.1  7.5 - 12.5 fL Final  ? Neutro Abs 08/08/2021 4,674  1,500 - 7,800 cells/uL Final  ? Lymphs Abs 08/08/2021 1,900  850 - 3,900 cells/uL Final  ? Absolute Monocytes 08/08/2021 676  200 - 950 cells/uL Final  ? Eosinophils Absolute 08/08/2021 289  15 - 500 cells/uL Final  ? Basophils Absolute 08/08/2021 61  0 - 200 cells/uL Final  ? Neutrophils Relative % 08/08/2021 61.5  % Final  ? Total Lymphocyte 08/08/2021 25.0  % Final  ? Monocytes Relative 08/08/2021 8.9  % Final  ? Eosinophils Relative 08/08/2021 3.8  % Final  ? Basophils Relative 08/08/2021 0.8  % Final  ? Cholesterol 08/08/2021 129  <200 mg/dL Final  ? HDL 08/08/2021 38 (L)  > OR  = 40 mg/dL Final  ? Triglycerides 08/08/2021 350 (H)  <150 mg/dL Final  ? Comment: . ?If a non-fasting specimen was collected, consider ?repeat triglyceride testing on a fasting specimen ?if clinically indicated.  ?Garald Balding al. J. of Clin. Lipidol. [id number]. ?. ?  ? LDL Cholesterol (Calc) 08/08/2021 54  mg/dL (calc) Final  ? Comment: Reference range: <100 ?Marland Kitchen ?Desirable range <100 mg/dL for primary prevention;   ?<70 mg/dL for patients with CHD or diabetic patients  ?with > or = 2 CHD risk factors. ?. ?LDL-C is now calculated using the Martin-Hopkins  ?calculation, which is a validated novel method providing  ?better accuracy than the Friedewald equation in the  ?estimation of LDL-C.  ?Cresenciano Genre et al. Annamaria Helling. [id number]): 2061-2068  ?(http://education.[facility].com/faq/FAQ164) ?  ? Total CHOL/HDL Ratio 08/08/2021 3.4  <5.0 (calc) Final  ? Non-HDL Cholesterol (Calc) 08/08/2021 91  <130 mg/dL (calc) Final  ? Comment: For patients with diabetes plus 1 major ASCVD risk  ?factor, treating to a non-HDL-C goal of <100 mg/dL  ?(LDL-C of <70 mg/dL) is considered a therapeutic  ?option. ?  ? ? ? ?Past Medical History:  ?Diagnosis Date  ? Macular degeneration   ? ?Past Surgical History:  ?Procedure Laterality Date  ? APPENDECTOMY  1958  ? ROTATOR CUFF REPAIR  07/07/2010  ? reattach Bicep also  ? ?Current Outpatient Medications on File Prior to Visit  ?Medication Sig Dispense Refill  ? allopurinol (ZYLOPRIM) 300 MG tablet TAKE 1 TABLET EVERY DAY 90 tablet 2  ? diphenhydrAMINE (BENADRYL) 25 MG tablet Take 25 mg by mouth every 6 (six) hours as needed for allergies.    ? gemfibrozil (LOPID) 600 MG tablet TAKE 1 TABLET EVERY DAY 90 tablet 1  ? Multiple Vitamin (MULTIVITAMIN) tablet Take 1 tablet by mouth daily with supper.    ? Multiple Vitamins-Minerals (ICAPS) CAPS Take 2 capsules by mouth 2 (two) times daily.    ? OVER THE COUNTER MEDICATION Collengula    ? OVER THE COUNTER MEDICATION Blueberry    ? Turmeric 400  MG CAPS Take by mouth.    ? ?No current facility-administered medications on file prior to visit.  ? ?No Known Allergies ?Social History  ? ?Socioeconomic History  ? Marital status: Unknown  ?  Spouse name: Not on file  ? Number of children: Not on file  ? Years of education: Not on file  ? Highest education level: Not on file  ?Occupational History  ? Not on file  ?Tobacco Use  ? Smoking status: Never  ? Smokeless tobacco: Never  ?Substance and Sexual Activity  ?  Alcohol use: No  ? Drug use: No  ? Sexual activity: Not Currently  ?Other Topics Concern  ? Not on file  ?Social History Narrative  ? Not on file  ? ?Social Determinants of Health  ? ?Financial Resource Strain: Not on file  ?Food Insecurity: Not on file  ?Transportation Needs: Not on file  ?Physical Activity: Not on file  ?Stress: Not on file  ?Social Connections: Not on file  ?Intimate Partner Violence: Not on file  ? ?Family History  ?Problem Relation Age of Onset  ? Hypertension Mother   ? Macular degeneration Mother   ? Cancer Father   ? Macular degeneration Sister   ? Early death Paternal Grandfather   ? Tuberculosis Paternal Grandfather   ? ? ? ?Review of Systems  ?All other systems reviewed and are negative. ? ?   ?Objective:  ? Physical Exam ?Vitals reviewed.  ?Constitutional:   ?   General: He is not in acute distress. ?   Appearance: He is well-developed.  ?HENT:  ?   Head: Normocephalic and atraumatic.  ?   Right Ear: External ear normal.  ?   Left Ear: External ear normal.  ?   Nose: Nose normal.  ?   Mouth/Throat:  ?   Pharynx: No oropharyngeal exudate.  ?Eyes:  ?   General: No scleral icterus.    ?   Left eye: No discharge.  ?   Conjunctiva/sclera: Conjunctivae normal.  ?   Pupils: Pupils are equal, round, and reactive to light.  ?Neck:  ?   Thyroid: No thyromegaly.  ?   Vascular: No JVD.  ?   Trachea: No tracheal deviation.  ?Cardiovascular:  ?   Rate and Rhythm: Normal rate and regular rhythm.  ?   Heart sounds: Normal heart sounds. No  murmur heard. ?  No friction rub. No gallop.  ?Pulmonary:  ?   Effort: Pulmonary effort is normal. No respiratory distress.  ?   Breath sounds: Normal breath sounds. No stridor. No wheezing or rales.  ?Chest:  ?

## 2021-08-15 NOTE — Telephone Encounter (Signed)
Requested medication (s) are due for refill today:   Prescribed yesterday 5/8 ? ?Requested medication (s) are on the active medication list:   Yes ? ?Future visit scheduled:   No ? ? ?Last ordered: Yesterday 08/14/2021 #120, 5 refills ? ?Returned because it's not covered by insurance.   An alternative is being requested.  ? ?Requested Prescriptions  ?Pending Prescriptions Disp Refills  ? icosapent Ethyl (VASCEPA) 1 g capsule [Pharmacy Med Name: ICOSAPENT ETHYL 1 GRAM CAPSULE] 120 capsule 5  ?  Sig: TAKE 2 CAPSULES BY MOUTH 2 TIMES DAILY.  ?  ? Off-Protocol Failed - 08/14/2021 10:14 AM  ?  ?  Failed - Medication not assigned to a protocol, review manually.  ?  ?  Passed - Valid encounter within last 12 months  ?  Recent Outpatient Visits   ? ?      ? Yesterday Routine general medical examination at a health care facility  ? [facility] Pickard, Priscille Heidelberg, MD  ? 4 months ago Viral upper respiratory tract infection  ? [facility] Valentino Nose, NP  ? 1 year ago Routine general medical examination at a health care facility  ? [facility] Pickard, Priscille Heidelberg, MD  ? 1 year ago Viral upper respiratory tract infection  ? [facility] Pickard, Priscille Heidelberg, MD  ? 2 years ago Routine general medical examination at a health care facility  ? [facility] Pickard, Priscille Heidelberg, MD  ? ?  ?  ? ? ?  ?  ?  ? ?

## 2021-08-18 NOTE — Telephone Encounter (Signed)
Per [employer] this medication is covered as a Tier 3 with a $45 co-pay. ? ?Spoke with pharmacist at [facility], he states they were running as generic and ins prefers name brand. This is ready for pick up. They will contact patient. Nothing further needed at this time.  ? ?

## 2021-09-28 ENCOUNTER — Other Ambulatory Visit: Payer: Self-pay

## 2021-09-28 ENCOUNTER — Telehealth: Payer: Self-pay

## 2021-09-28 MED ORDER — ICOSAPENT ETHYL 1 G PO CAPS: 2.0000 g | ORAL_CAPSULE | Freq: Two times a day (BID) | ORAL | 0 refills | Status: DC

## 2021-09-28 NOTE — Telephone Encounter (Signed)
Pt called wanting to know  Should pt continue to take all the following meds:   Gemfibrozil 600mg  Vascepa 1g (have not started yet/plan to soon) Fish oil  2. If pt should start to take the Vascepa, per pt only wants to try it for 1 qty 60, to see if he tol and then decide from there.  3. For med refill pls send to [facility].   Please advice?

## 2021-10-27 DIAGNOSIS — M9904 Segmental and somatic dysfunction of sacral region: Secondary | ICD-10-CM | POA: Diagnosis not present

## 2021-10-27 DIAGNOSIS — M9901 Segmental and somatic dysfunction of cervical region: Secondary | ICD-10-CM | POA: Diagnosis not present

## 2021-10-27 DIAGNOSIS — M9903 Segmental and somatic dysfunction of lumbar region: Secondary | ICD-10-CM | POA: Diagnosis not present

## 2021-10-27 DIAGNOSIS — M9902 Segmental and somatic dysfunction of thoracic region: Secondary | ICD-10-CM | POA: Diagnosis not present

## 2021-10-31 ENCOUNTER — Other Ambulatory Visit: Payer: Self-pay | Admitting: Family Medicine

## 2021-10-31 DIAGNOSIS — M9901 Segmental and somatic dysfunction of cervical region: Secondary | ICD-10-CM | POA: Diagnosis not present

## 2021-10-31 DIAGNOSIS — M9904 Segmental and somatic dysfunction of sacral region: Secondary | ICD-10-CM | POA: Diagnosis not present

## 2021-10-31 DIAGNOSIS — M9903 Segmental and somatic dysfunction of lumbar region: Secondary | ICD-10-CM | POA: Diagnosis not present

## 2021-10-31 DIAGNOSIS — M9902 Segmental and somatic dysfunction of thoracic region: Secondary | ICD-10-CM | POA: Diagnosis not present

## 2021-11-02 DIAGNOSIS — M9904 Segmental and somatic dysfunction of sacral region: Secondary | ICD-10-CM | POA: Diagnosis not present

## 2021-11-02 DIAGNOSIS — M9902 Segmental and somatic dysfunction of thoracic region: Secondary | ICD-10-CM | POA: Diagnosis not present

## 2021-11-02 DIAGNOSIS — M9903 Segmental and somatic dysfunction of lumbar region: Secondary | ICD-10-CM | POA: Diagnosis not present

## 2021-11-02 DIAGNOSIS — M9901 Segmental and somatic dysfunction of cervical region: Secondary | ICD-10-CM | POA: Diagnosis not present

## 2021-11-07 DIAGNOSIS — M9904 Segmental and somatic dysfunction of sacral region: Secondary | ICD-10-CM | POA: Diagnosis not present

## 2021-11-07 DIAGNOSIS — M9902 Segmental and somatic dysfunction of thoracic region: Secondary | ICD-10-CM | POA: Diagnosis not present

## 2021-11-07 DIAGNOSIS — M9901 Segmental and somatic dysfunction of cervical region: Secondary | ICD-10-CM | POA: Diagnosis not present

## 2021-11-07 DIAGNOSIS — M9903 Segmental and somatic dysfunction of lumbar region: Secondary | ICD-10-CM | POA: Diagnosis not present

## 2021-11-09 DIAGNOSIS — M9902 Segmental and somatic dysfunction of thoracic region: Secondary | ICD-10-CM | POA: Diagnosis not present

## 2021-11-09 DIAGNOSIS — M9903 Segmental and somatic dysfunction of lumbar region: Secondary | ICD-10-CM | POA: Diagnosis not present

## 2021-11-09 DIAGNOSIS — M9901 Segmental and somatic dysfunction of cervical region: Secondary | ICD-10-CM | POA: Diagnosis not present

## 2021-11-09 DIAGNOSIS — M9904 Segmental and somatic dysfunction of sacral region: Secondary | ICD-10-CM | POA: Diagnosis not present

## 2021-11-13 ENCOUNTER — Other Ambulatory Visit: Payer: Self-pay | Admitting: Family Medicine

## 2021-11-13 DIAGNOSIS — H353211 Exudative age-related macular degeneration, right eye, with active choroidal neovascularization: Secondary | ICD-10-CM | POA: Diagnosis not present

## 2021-11-14 DIAGNOSIS — M9901 Segmental and somatic dysfunction of cervical region: Secondary | ICD-10-CM | POA: Diagnosis not present

## 2021-11-14 DIAGNOSIS — M9903 Segmental and somatic dysfunction of lumbar region: Secondary | ICD-10-CM | POA: Diagnosis not present

## 2021-11-14 DIAGNOSIS — M9904 Segmental and somatic dysfunction of sacral region: Secondary | ICD-10-CM | POA: Diagnosis not present

## 2021-11-14 DIAGNOSIS — M9902 Segmental and somatic dysfunction of thoracic region: Secondary | ICD-10-CM | POA: Diagnosis not present

## 2021-11-16 DIAGNOSIS — M9902 Segmental and somatic dysfunction of thoracic region: Secondary | ICD-10-CM | POA: Diagnosis not present

## 2021-11-16 DIAGNOSIS — M9901 Segmental and somatic dysfunction of cervical region: Secondary | ICD-10-CM | POA: Diagnosis not present

## 2021-11-16 DIAGNOSIS — Z0100 Encounter for examination of eyes and vision without abnormal findings: Secondary | ICD-10-CM | POA: Diagnosis not present

## 2021-11-16 DIAGNOSIS — H353211 Exudative age-related macular degeneration, right eye, with active choroidal neovascularization: Secondary | ICD-10-CM | POA: Diagnosis not present

## 2021-11-16 DIAGNOSIS — M9904 Segmental and somatic dysfunction of sacral region: Secondary | ICD-10-CM | POA: Diagnosis not present

## 2021-11-16 DIAGNOSIS — M9903 Segmental and somatic dysfunction of lumbar region: Secondary | ICD-10-CM | POA: Diagnosis not present

## 2021-11-21 DIAGNOSIS — M9903 Segmental and somatic dysfunction of lumbar region: Secondary | ICD-10-CM | POA: Diagnosis not present

## 2021-11-21 DIAGNOSIS — M9901 Segmental and somatic dysfunction of cervical region: Secondary | ICD-10-CM | POA: Diagnosis not present

## 2021-11-21 DIAGNOSIS — M9902 Segmental and somatic dysfunction of thoracic region: Secondary | ICD-10-CM | POA: Diagnosis not present

## 2021-11-21 DIAGNOSIS — M9904 Segmental and somatic dysfunction of sacral region: Secondary | ICD-10-CM | POA: Diagnosis not present

## 2021-11-23 DIAGNOSIS — M9901 Segmental and somatic dysfunction of cervical region: Secondary | ICD-10-CM | POA: Diagnosis not present

## 2021-11-23 DIAGNOSIS — M9903 Segmental and somatic dysfunction of lumbar region: Secondary | ICD-10-CM | POA: Diagnosis not present

## 2021-11-23 DIAGNOSIS — M9902 Segmental and somatic dysfunction of thoracic region: Secondary | ICD-10-CM | POA: Diagnosis not present

## 2021-11-23 DIAGNOSIS — M9904 Segmental and somatic dysfunction of sacral region: Secondary | ICD-10-CM | POA: Diagnosis not present

## 2021-11-28 DIAGNOSIS — M9902 Segmental and somatic dysfunction of thoracic region: Secondary | ICD-10-CM | POA: Diagnosis not present

## 2021-11-28 DIAGNOSIS — M9901 Segmental and somatic dysfunction of cervical region: Secondary | ICD-10-CM | POA: Diagnosis not present

## 2021-11-28 DIAGNOSIS — M9903 Segmental and somatic dysfunction of lumbar region: Secondary | ICD-10-CM | POA: Diagnosis not present

## 2021-11-28 DIAGNOSIS — M9904 Segmental and somatic dysfunction of sacral region: Secondary | ICD-10-CM | POA: Diagnosis not present

## 2021-11-30 DIAGNOSIS — M9904 Segmental and somatic dysfunction of sacral region: Secondary | ICD-10-CM | POA: Diagnosis not present

## 2021-11-30 DIAGNOSIS — M9902 Segmental and somatic dysfunction of thoracic region: Secondary | ICD-10-CM | POA: Diagnosis not present

## 2021-11-30 DIAGNOSIS — M9903 Segmental and somatic dysfunction of lumbar region: Secondary | ICD-10-CM | POA: Diagnosis not present

## 2021-11-30 DIAGNOSIS — M9901 Segmental and somatic dysfunction of cervical region: Secondary | ICD-10-CM | POA: Diagnosis not present

## 2021-12-05 DIAGNOSIS — M9901 Segmental and somatic dysfunction of cervical region: Secondary | ICD-10-CM | POA: Diagnosis not present

## 2021-12-05 DIAGNOSIS — M9904 Segmental and somatic dysfunction of sacral region: Secondary | ICD-10-CM | POA: Diagnosis not present

## 2021-12-05 DIAGNOSIS — M9903 Segmental and somatic dysfunction of lumbar region: Secondary | ICD-10-CM | POA: Diagnosis not present

## 2021-12-05 DIAGNOSIS — M9902 Segmental and somatic dysfunction of thoracic region: Secondary | ICD-10-CM | POA: Diagnosis not present

## 2021-12-07 DIAGNOSIS — M9902 Segmental and somatic dysfunction of thoracic region: Secondary | ICD-10-CM | POA: Diagnosis not present

## 2021-12-07 DIAGNOSIS — M9904 Segmental and somatic dysfunction of sacral region: Secondary | ICD-10-CM | POA: Diagnosis not present

## 2021-12-07 DIAGNOSIS — M9901 Segmental and somatic dysfunction of cervical region: Secondary | ICD-10-CM | POA: Diagnosis not present

## 2021-12-07 DIAGNOSIS — M9903 Segmental and somatic dysfunction of lumbar region: Secondary | ICD-10-CM | POA: Diagnosis not present

## 2021-12-18 ENCOUNTER — Other Ambulatory Visit: Payer: Self-pay | Admitting: Family Medicine

## 2022-01-11 DIAGNOSIS — M9904 Segmental and somatic dysfunction of sacral region: Secondary | ICD-10-CM | POA: Diagnosis not present

## 2022-01-11 DIAGNOSIS — M9902 Segmental and somatic dysfunction of thoracic region: Secondary | ICD-10-CM | POA: Diagnosis not present

## 2022-01-11 DIAGNOSIS — M9903 Segmental and somatic dysfunction of lumbar region: Secondary | ICD-10-CM | POA: Diagnosis not present

## 2022-01-11 DIAGNOSIS — M9901 Segmental and somatic dysfunction of cervical region: Secondary | ICD-10-CM | POA: Diagnosis not present

## 2022-01-12 ENCOUNTER — Other Ambulatory Visit: Payer: Self-pay | Admitting: Family Medicine

## 2022-01-12 NOTE — Telephone Encounter (Signed)
Requested medications are due for refill today.  yes  Requested medications are on the active medications list.  yes  Last refill. 10/31/2021 #120 3 rf  Future visit scheduled.   yes  Notes to clinic.  Refill not delegated.    Requested Prescriptions  Pending Prescriptions Disp Refills   VASCEPA 1 g capsule [Pharmacy Med Name: VASCEPA 1 GM Capsule] 360 capsule 10    Sig: TAKE 2 CAPSULES TWICE DAILY     Off-Protocol Failed - 01/12/2022  4:21 AM      Failed - Medication not assigned to a protocol, review manually.      Passed - Valid encounter within last 12 months    Recent Outpatient Visits           5 months ago Routine general medical examination at a health care facility   [place], Cammie Mcgee, MD   9 months ago Viral upper respiratory tract infection   [place] Eulogio Bear, NP   1 year ago Routine general medical examination at a health care facility   [place], Cammie Mcgee, MD   1 year ago Viral upper respiratory tract infection   [place] Susy Frizzle, MD   2 years ago Routine general medical examination at a health care facility   [place], Cammie Mcgee, MD

## 2022-01-25 DIAGNOSIS — M9903 Segmental and somatic dysfunction of lumbar region: Secondary | ICD-10-CM | POA: Diagnosis not present

## 2022-01-25 DIAGNOSIS — M9901 Segmental and somatic dysfunction of cervical region: Secondary | ICD-10-CM | POA: Diagnosis not present

## 2022-01-25 DIAGNOSIS — M9902 Segmental and somatic dysfunction of thoracic region: Secondary | ICD-10-CM | POA: Diagnosis not present

## 2022-01-25 DIAGNOSIS — M9904 Segmental and somatic dysfunction of sacral region: Secondary | ICD-10-CM | POA: Diagnosis not present

## 2022-02-08 DIAGNOSIS — M9901 Segmental and somatic dysfunction of cervical region: Secondary | ICD-10-CM | POA: Diagnosis not present

## 2022-02-08 DIAGNOSIS — M9903 Segmental and somatic dysfunction of lumbar region: Secondary | ICD-10-CM | POA: Diagnosis not present

## 2022-02-08 DIAGNOSIS — M9902 Segmental and somatic dysfunction of thoracic region: Secondary | ICD-10-CM | POA: Diagnosis not present

## 2022-02-08 DIAGNOSIS — M9904 Segmental and somatic dysfunction of sacral region: Secondary | ICD-10-CM | POA: Diagnosis not present

## 2022-02-20 DIAGNOSIS — H353211 Exudative age-related macular degeneration, right eye, with active choroidal neovascularization: Secondary | ICD-10-CM | POA: Diagnosis not present

## 2022-02-22 DIAGNOSIS — M9901 Segmental and somatic dysfunction of cervical region: Secondary | ICD-10-CM | POA: Diagnosis not present

## 2022-02-22 DIAGNOSIS — M9904 Segmental and somatic dysfunction of sacral region: Secondary | ICD-10-CM | POA: Diagnosis not present

## 2022-02-22 DIAGNOSIS — M9902 Segmental and somatic dysfunction of thoracic region: Secondary | ICD-10-CM | POA: Diagnosis not present

## 2022-02-22 DIAGNOSIS — M9903 Segmental and somatic dysfunction of lumbar region: Secondary | ICD-10-CM | POA: Diagnosis not present

## 2022-03-08 DIAGNOSIS — M9904 Segmental and somatic dysfunction of sacral region: Secondary | ICD-10-CM | POA: Diagnosis not present

## 2022-03-08 DIAGNOSIS — M9902 Segmental and somatic dysfunction of thoracic region: Secondary | ICD-10-CM | POA: Diagnosis not present

## 2022-03-08 DIAGNOSIS — M9901 Segmental and somatic dysfunction of cervical region: Secondary | ICD-10-CM | POA: Diagnosis not present

## 2022-03-08 DIAGNOSIS — M9903 Segmental and somatic dysfunction of lumbar region: Secondary | ICD-10-CM | POA: Diagnosis not present

## 2022-03-13 ENCOUNTER — Encounter: Payer: Self-pay | Admitting: Family Medicine

## 2022-03-13 ENCOUNTER — Ambulatory Visit (INDEPENDENT_AMBULATORY_CARE_PROVIDER_SITE_OTHER): Payer: Medicare HMO | Admitting: Family Medicine

## 2022-03-13 VITALS — BP 130/98 | HR 92 | Temp 97.8°F | Ht 68.0 in | Wt 216.0 lb

## 2022-03-13 DIAGNOSIS — H6690 Otitis media, unspecified, unspecified ear: Secondary | ICD-10-CM | POA: Diagnosis not present

## 2022-03-13 MED ORDER — AMOXICILLIN-POT CLAVULANATE 500-125 MG PO TABS: 1.0000 | ORAL_TABLET | Freq: Two times a day (BID) | ORAL | 0 refills | Status: AC

## 2022-03-13 NOTE — Progress Notes (Signed)
Acute Office Visit  Subjective:     Patient ID: Joshua Trevino., male    DOB: Sep 08, 1950, 71 y.o.   MRN: [id number]  Chief Complaint  Patient presents with   Follow-up    Poss ear infection both ears, equilibrium off balance started last week    HPI Patient is in today for pain and pressure in both ears since last week associated with malaise and fatigue. Has also noticed some congestion with rhinorrhea and sore throat this AM. Denies fever, chills, night sweats, drainage, change in hearing, cough, sinus pressure, or headache. Has tried q-tips with witch hazel.  Review of Systems  Reason unable to perform ROS: see HPI.  All other systems reviewed and are negative.   Past Medical History:  Diagnosis Date   Macular degeneration    Past Surgical History:  Procedure Laterality Date   APPENDECTOMY  1958   ROTATOR CUFF REPAIR  07/07/2010   reattach Bicep also   Current Outpatient Medications on File Prior to Visit  Medication Sig Dispense Refill   allopurinol (ZYLOPRIM) 300 MG tablet TAKE 1 TABLET EVERY DAY 90 tablet 2   diphenhydrAMINE (BENADRYL) 25 MG tablet Take 25 mg by mouth every 6 (six) hours as needed for allergies.     gemfibrozil (LOPID) 600 MG tablet TAKE 1 TABLET EVERY DAY 90 tablet 1   Multiple Vitamin (MULTIVITAMIN) tablet Take 1 tablet by mouth daily with supper.     Multiple Vitamins-Minerals (ICAPS) CAPS Take 2 capsules by mouth 2 (two) times daily.     OVER THE COUNTER MEDICATION Collengula     OVER THE COUNTER MEDICATION Blueberry     Turmeric 400 MG CAPS Take by mouth.     VASCEPA 1 g capsule TAKE 2 CAPSULES TWICE DAILY 360 capsule 3   No current facility-administered medications on file prior to visit.   No Known Allergies     Objective:    BP (!) 130/98   Pulse 92   Temp 97.8 F (36.6 C) (Oral)   Ht 5\' 8"  (1.727 m)   Wt 216 lb (98 kg)   SpO2 95%   BMI 32.84 kg/m    Physical Exam Vitals and nursing note reviewed.  Constitutional:       Appearance: Normal appearance. He is normal weight.  HENT:     Head: Normocephalic and atraumatic.     Right Ear: Ear canal and external ear normal. Decreased hearing noted. Tenderness present. There is impacted cerumen. Tympanic membrane is erythematous.     Left Ear: Ear canal and external ear normal. There is impacted cerumen.     Nose: Nose normal.     Mouth/Throat:     Pharynx: Posterior oropharyngeal erythema present.  Eyes:     Conjunctiva/sclera:     Right eye: Right conjunctiva is injected.     Left eye: Left conjunctiva is injected.  Cardiovascular:     Rate and Rhythm: Normal rate and regular rhythm.     Pulses: Normal pulses.     Heart sounds: Normal heart sounds.  Skin:    General: Skin is warm and dry.     Capillary Refill: Capillary refill takes less than 2 seconds.  Neurological:     General: No focal deficit present.     Mental Status: He is alert and oriented to person, place, and time. Mental status is at baseline.  Psychiatric:        Mood and Affect: Mood normal.  Behavior: Behavior normal.        Thought Content: Thought content normal.        Judgment: Judgment normal.     No results found for any visits on 03/13/22.      Assessment & Plan:   Problem List Items Addressed This Visit       Nervous and Auditory   Acute otitis media - Primary    Symptoms and exam consistent with mild AOM alongside worsening symptoms of sinus infection. Will start Augmentin to cover for both. Push fluids and rest. May use Mucinex or Flonase OTC and Tylenol for pain. Return to office if symptoms persist or worsen to include fever, swelling, facial numbness, or symptoms consistent with worsening bacterial sinus infection.      Relevant Medications   amoxicillin-clavulanate (AUGMENTIN) 500-125 MG tablet    Meds ordered this encounter  Medications   amoxicillin-clavulanate (AUGMENTIN) 500-125 MG tablet    Sig: Take 1 tablet by mouth 2 (two) times daily for  7 days.    Dispense:  14 tablet    Refill:  0    Order Specific Question:   Supervising Provider    Answer:   Lynnea Ferrier T [[id number]]    Return if symptoms worsen or fail to improve.  Park Meo, FNP

## 2022-03-13 NOTE — Assessment & Plan Note (Addendum)
Symptoms and exam consistent with mild AOM alongside worsening symptoms of sinus infection. Will start Augmentin to cover for both. Push fluids and rest. May use Mucinex or Flonase OTC and Tylenol for pain. Return to office if symptoms persist or worsen to include fever, swelling, facial numbness, or symptoms consistent with worsening bacterial sinus infection.

## 2022-03-15 DIAGNOSIS — M9902 Segmental and somatic dysfunction of thoracic region: Secondary | ICD-10-CM | POA: Diagnosis not present

## 2022-03-15 DIAGNOSIS — M9901 Segmental and somatic dysfunction of cervical region: Secondary | ICD-10-CM | POA: Diagnosis not present

## 2022-03-15 DIAGNOSIS — M9904 Segmental and somatic dysfunction of sacral region: Secondary | ICD-10-CM | POA: Diagnosis not present

## 2022-03-15 DIAGNOSIS — M9903 Segmental and somatic dysfunction of lumbar region: Secondary | ICD-10-CM | POA: Diagnosis not present

## 2022-04-12 DIAGNOSIS — M9903 Segmental and somatic dysfunction of lumbar region: Secondary | ICD-10-CM | POA: Diagnosis not present

## 2022-04-12 DIAGNOSIS — M9902 Segmental and somatic dysfunction of thoracic region: Secondary | ICD-10-CM | POA: Diagnosis not present

## 2022-04-12 DIAGNOSIS — M9904 Segmental and somatic dysfunction of sacral region: Secondary | ICD-10-CM | POA: Diagnosis not present

## 2022-04-12 DIAGNOSIS — M9901 Segmental and somatic dysfunction of cervical region: Secondary | ICD-10-CM | POA: Diagnosis not present

## 2022-04-26 DIAGNOSIS — M9903 Segmental and somatic dysfunction of lumbar region: Secondary | ICD-10-CM | POA: Diagnosis not present

## 2022-04-26 DIAGNOSIS — M9901 Segmental and somatic dysfunction of cervical region: Secondary | ICD-10-CM | POA: Diagnosis not present

## 2022-04-26 DIAGNOSIS — M9904 Segmental and somatic dysfunction of sacral region: Secondary | ICD-10-CM | POA: Diagnosis not present

## 2022-04-26 DIAGNOSIS — M9902 Segmental and somatic dysfunction of thoracic region: Secondary | ICD-10-CM | POA: Diagnosis not present

## 2022-05-10 DIAGNOSIS — M9904 Segmental and somatic dysfunction of sacral region: Secondary | ICD-10-CM | POA: Diagnosis not present

## 2022-05-10 DIAGNOSIS — M9903 Segmental and somatic dysfunction of lumbar region: Secondary | ICD-10-CM | POA: Diagnosis not present

## 2022-05-10 DIAGNOSIS — M9901 Segmental and somatic dysfunction of cervical region: Secondary | ICD-10-CM | POA: Diagnosis not present

## 2022-05-10 DIAGNOSIS — M9902 Segmental and somatic dysfunction of thoracic region: Secondary | ICD-10-CM | POA: Diagnosis not present

## 2022-05-24 DIAGNOSIS — M9903 Segmental and somatic dysfunction of lumbar region: Secondary | ICD-10-CM | POA: Diagnosis not present

## 2022-05-24 DIAGNOSIS — M9904 Segmental and somatic dysfunction of sacral region: Secondary | ICD-10-CM | POA: Diagnosis not present

## 2022-05-24 DIAGNOSIS — M9902 Segmental and somatic dysfunction of thoracic region: Secondary | ICD-10-CM | POA: Diagnosis not present

## 2022-05-24 DIAGNOSIS — M9901 Segmental and somatic dysfunction of cervical region: Secondary | ICD-10-CM | POA: Diagnosis not present

## 2022-05-25 DIAGNOSIS — H353211 Exudative age-related macular degeneration, right eye, with active choroidal neovascularization: Secondary | ICD-10-CM | POA: Diagnosis not present

## 2022-06-07 DIAGNOSIS — M9904 Segmental and somatic dysfunction of sacral region: Secondary | ICD-10-CM | POA: Diagnosis not present

## 2022-06-07 DIAGNOSIS — M9903 Segmental and somatic dysfunction of lumbar region: Secondary | ICD-10-CM | POA: Diagnosis not present

## 2022-06-07 DIAGNOSIS — M9902 Segmental and somatic dysfunction of thoracic region: Secondary | ICD-10-CM | POA: Diagnosis not present

## 2022-06-07 DIAGNOSIS — M9901 Segmental and somatic dysfunction of cervical region: Secondary | ICD-10-CM | POA: Diagnosis not present

## 2022-06-21 DIAGNOSIS — M9902 Segmental and somatic dysfunction of thoracic region: Secondary | ICD-10-CM | POA: Diagnosis not present

## 2022-06-21 DIAGNOSIS — M9903 Segmental and somatic dysfunction of lumbar region: Secondary | ICD-10-CM | POA: Diagnosis not present

## 2022-06-21 DIAGNOSIS — M9901 Segmental and somatic dysfunction of cervical region: Secondary | ICD-10-CM | POA: Diagnosis not present

## 2022-06-21 DIAGNOSIS — M9904 Segmental and somatic dysfunction of sacral region: Secondary | ICD-10-CM | POA: Diagnosis not present

## 2022-07-05 DIAGNOSIS — M9901 Segmental and somatic dysfunction of cervical region: Secondary | ICD-10-CM | POA: Diagnosis not present

## 2022-07-05 DIAGNOSIS — M9904 Segmental and somatic dysfunction of sacral region: Secondary | ICD-10-CM | POA: Diagnosis not present

## 2022-07-05 DIAGNOSIS — M9903 Segmental and somatic dysfunction of lumbar region: Secondary | ICD-10-CM | POA: Diagnosis not present

## 2022-07-05 DIAGNOSIS — M9902 Segmental and somatic dysfunction of thoracic region: Secondary | ICD-10-CM | POA: Diagnosis not present

## 2022-07-19 DIAGNOSIS — M9903 Segmental and somatic dysfunction of lumbar region: Secondary | ICD-10-CM | POA: Diagnosis not present

## 2022-07-19 DIAGNOSIS — M9904 Segmental and somatic dysfunction of sacral region: Secondary | ICD-10-CM | POA: Diagnosis not present

## 2022-07-19 DIAGNOSIS — M9901 Segmental and somatic dysfunction of cervical region: Secondary | ICD-10-CM | POA: Diagnosis not present

## 2022-07-19 DIAGNOSIS — M9902 Segmental and somatic dysfunction of thoracic region: Secondary | ICD-10-CM | POA: Diagnosis not present

## 2022-08-02 DIAGNOSIS — M9904 Segmental and somatic dysfunction of sacral region: Secondary | ICD-10-CM | POA: Diagnosis not present

## 2022-08-02 DIAGNOSIS — M9901 Segmental and somatic dysfunction of cervical region: Secondary | ICD-10-CM | POA: Diagnosis not present

## 2022-08-02 DIAGNOSIS — M9902 Segmental and somatic dysfunction of thoracic region: Secondary | ICD-10-CM | POA: Diagnosis not present

## 2022-08-02 DIAGNOSIS — M9903 Segmental and somatic dysfunction of lumbar region: Secondary | ICD-10-CM | POA: Diagnosis not present

## 2022-08-03 ENCOUNTER — Other Ambulatory Visit: Payer: Self-pay | Admitting: Family Medicine

## 2022-08-03 NOTE — Telephone Encounter (Signed)
Requested medications are due for refill today.  yes  Requested medications are on the active medications list.  yes  Last refill. 12/18/2021 #90 1 rf  Future visit scheduled.   no  Notes to clinic.  Labs are expired.    Requested Prescriptions  Pending Prescriptions Disp Refills   gemfibrozil (LOPID) 600 MG tablet [Pharmacy Med Name: GEMFIBROZIL 600 MG Tablet] 90 tablet 3    Sig: TAKE 1 TABLET EVERY DAY     Cardiovascular:  Antilipid - Fibric Acid Derivatives Failed - 08/03/2022  2:32 AM      Failed - eGFR is 30 or above and within 360 days    GFR, Est African American  Date Value Ref Range Status  08/05/2020 94 > OR = 60 mL/min/1.73m2 Final   GFR, Est Non African American  Date Value Ref Range Status  08/05/2020 81 > OR = 60 mL/min/1.61m2 Final         Failed - Valid encounter within last 12 months    Recent Outpatient Visits           11 months ago Routine general medical examination at a health care facility   [facility] Pickard, Priscille Heidelberg, MD   1 year ago Viral upper respiratory tract infection   [facility] Valentino Nose, NP   1 year ago Routine general medical examination at a health care facility   [facility] Pickard, Priscille Heidelberg, MD   2 years ago Viral upper respiratory tract infection   [facility] Tanya Nones, Priscille Heidelberg, MD   2 years ago Routine general medical examination at a health care facility   [facility] Pickard, Priscille Heidelberg, MD              Failed - Lipid Panel in normal range within the last 12 months    Cholesterol  Date Value Ref Range Status  08/08/2021 129 <200 mg/dL Final   LDL Cholesterol (Calc)  Date Value Ref Range Status  08/08/2021 54 mg/dL (calc) Final    Comment:    Reference range: <100 . Desirable range <100 mg/dL for primary prevention;   <70 mg/dL for patients with CHD or diabetic patients  with > or = 2 CHD risk factors. Marland Kitchen LDL-C is now  calculated using the Martin-Hopkins  calculation, which is a validated novel method providing  better accuracy than the Friedewald equation in the  estimation of LDL-C.  Horald Pollen et al. Lenox Ahr. 1610;960(45): 2061-2068  (http://education.[facility].com/faq/FAQ164)    HDL  Date Value Ref Range Status  08/08/2021 38 (L) > OR = 40 mg/dL Final   Triglycerides  Date Value Ref Range Status  08/08/2021 350 (H) <150 mg/dL Final    Comment:    . If a non-fasting specimen was collected, consider repeat triglyceride testing on a fasting specimen if clinically indicated.  Perry Mount et al. J. of Clin. Lipidol. 2015;9:129-169. Marland Kitchen          Passed - ALT in normal range and within 360 days    ALT  Date Value Ref Range Status  08/08/2021 27 9 - 46 U/L Final         Passed - AST in normal range and within 360 days    AST  Date Value Ref Range Status  08/08/2021 26 10 - 35 U/L Final         Passed - Cr in normal range and within 360 days    Creat  Date Value Ref Range Status  08/08/2021 0.79 0.70 - 1.28 mg/dL Final         Passed - HGB in normal range and within 360 days    Hemoglobin  Date Value Ref Range Status  08/08/2021 15.4 13.2 - 17.1 g/dL Final         Passed - HCT in normal range and within 360 days    HCT  Date Value Ref Range Status  08/08/2021 45.7 38.5 - 50.0 % Final         Passed - PLT in normal range and within 360 days    Platelets  Date Value Ref Range Status  08/08/2021 294 140 - 400 Thousand/uL Final         Passed - WBC in normal range and within 360 days    WBC  Date Value Ref Range Status  08/08/2021 7.6 3.8 - 10.8 Thousand/uL Final

## 2022-08-10 ENCOUNTER — Other Ambulatory Visit: Payer: Medicare HMO

## 2022-08-10 DIAGNOSIS — Z125 Encounter for screening for malignant neoplasm of prostate: Secondary | ICD-10-CM | POA: Diagnosis not present

## 2022-08-10 DIAGNOSIS — E781 Pure hyperglyceridemia: Secondary | ICD-10-CM | POA: Diagnosis not present

## 2022-08-10 DIAGNOSIS — Z0000 Encounter for general adult medical examination without abnormal findings: Secondary | ICD-10-CM

## 2022-08-10 DIAGNOSIS — E8881 Metabolic syndrome: Secondary | ICD-10-CM

## 2022-08-11 LAB — LIPID PANEL
Cholesterol: 128 mg/dL (ref <200)
HDL: 37 mg/dL — ABNORMAL LOW (ref >=40)
LDL Cholesterol (Calc): 62 mg/dL (calc)
Non-HDL Cholesterol (Calc): 91 mg/dL (calc) (ref <130)
Total CHOL/HDL Ratio: 3.5 (calc) (ref <5.0)
Triglycerides: 218 mg/dL — ABNORMAL HIGH (ref <150)

## 2022-08-11 LAB — CBC WITH DIFFERENTIAL/PLATELET
Absolute Monocytes: 861 cells/uL (ref 200–950)
Basophils Absolute: 52 cells/uL (ref 0–200)
Basophils Relative: 0.6 %
Eosinophils Absolute: 400 cells/uL (ref 15–500)
Eosinophils Relative: 4.6 %
HCT: 45.9 % (ref 38.5–50.0)
Hemoglobin: 15.2 g/dL (ref 13.2–17.1)
Lymphs Abs: 1923 cells/uL (ref 850–3900)
MCH: 29.6 pg (ref 27.0–33.0)
MCHC: 33.1 g/dL (ref 32.0–36.0)
MCV: 89.3 fL (ref 80.0–100.0)
MPV: 9.6 fL (ref 7.5–12.5)
Monocytes Relative: 9.9 %
Neutro Abs: 5464 cells/uL (ref 1500–7800)
Neutrophils Relative %: 62.8 %
Platelets: 292 10*3/uL (ref 140–400)
RBC: 5.14 10*6/uL (ref 4.20–5.80)
RDW: 13.1 % (ref 11.0–15.0)
Total Lymphocyte: 22.1 %
WBC: 8.7 10*3/uL (ref 3.8–10.8)

## 2022-08-11 LAB — COMPLETE METABOLIC PANEL WITH GFR
AG Ratio: 1.6 (calc) (ref 1.0–2.5)
ALT: 25 U/L (ref 9–46)
AST: 30 U/L (ref 10–35)
Albumin: 4.4 g/dL (ref 3.6–5.1)
Alkaline phosphatase (APISO): 89 U/L (ref 35–144)
BUN: 14 mg/dL (ref 7–25)
CO2: 28 mmol/L (ref 20–32)
Calcium: 9.1 mg/dL (ref 8.6–10.3)
Chloride: 102 mmol/L (ref 98–110)
Creat: 0.86 mg/dL (ref 0.70–1.28)
Globulin: 2.7 g/dL (calc) (ref 1.9–3.7)
Glucose, Bld: 111 mg/dL — ABNORMAL HIGH (ref 65–99)
Potassium: 4.2 mmol/L (ref 3.5–5.3)
Sodium: 141 mmol/L (ref 135–146)
Total Bilirubin: 0.8 mg/dL (ref 0.2–1.2)
Total Protein: 7.1 g/dL (ref 6.1–8.1)
eGFR: 93 mL/min/{1.73_m2} (ref >=60)

## 2022-08-11 LAB — PSA: PSA: 1.56 ng/mL (ref <=4.00)

## 2022-08-16 ENCOUNTER — Encounter: Payer: Self-pay | Admitting: Family Medicine

## 2022-08-16 ENCOUNTER — Ambulatory Visit: Payer: Medicare HMO | Admitting: Family Medicine

## 2022-08-16 VITALS — BP 128/78 | HR 88 | Temp 98.6°F | Ht 68.0 in | Wt 214.0 lb

## 2022-08-16 DIAGNOSIS — M9903 Segmental and somatic dysfunction of lumbar region: Secondary | ICD-10-CM | POA: Diagnosis not present

## 2022-08-16 DIAGNOSIS — M9901 Segmental and somatic dysfunction of cervical region: Secondary | ICD-10-CM | POA: Diagnosis not present

## 2022-08-16 DIAGNOSIS — Z0001 Encounter for general adult medical examination with abnormal findings: Secondary | ICD-10-CM

## 2022-08-16 DIAGNOSIS — Z0000 Encounter for general adult medical examination without abnormal findings: Secondary | ICD-10-CM

## 2022-08-16 DIAGNOSIS — M9902 Segmental and somatic dysfunction of thoracic region: Secondary | ICD-10-CM | POA: Diagnosis not present

## 2022-08-16 DIAGNOSIS — E8881 Metabolic syndrome: Secondary | ICD-10-CM

## 2022-08-16 DIAGNOSIS — E781 Pure hyperglyceridemia: Secondary | ICD-10-CM

## 2022-08-16 DIAGNOSIS — M9904 Segmental and somatic dysfunction of sacral region: Secondary | ICD-10-CM | POA: Diagnosis not present

## 2022-08-16 NOTE — Progress Notes (Signed)
Subjective:    Patient ID: Joshua Trevino., male    DOB: 12/16/1950, 72 y.o.   MRN: [id number]  HPI  Patient is a very pleasant 72 year old white male here for CPE.  Patient has a past medical history of gout, hypertriglyceridemia, dyslipidemia, mild elevations in his blood sugar and obesity.  He qualifies for metabolic syndrome.  He denies any falls or depression or memory loss.  His immunizations are up-to-date as demonstrated below.  His most recent lab work is listed below Immunization History  Administered Date(s) Administered   Influenza, High Dose Seasonal PF 01/28/2017   Influenza,inj,Quad PF,6+ Mos 01/13/2018, 01/26/2019   Influenza-Unspecified 03/13/2016   Moderna Sars-Covid-2 Vaccination 11/02/2019, 12/02/2019   Pneumococcal Conjugate-13 04/12/2017, 01/26/2019   Pneumococcal Polysaccharide-23 07/09/2016   Tdap 07/08/2015   Zoster Recombinat (Shingrix) 07/16/2017, 10/01/2017     Lab on 08/10/2022  Component Date Value Ref Range Status   WBC 08/10/2022 8.7  3.8 - 10.8 Thousand/uL Final   RBC 08/10/2022 5.14  4.20 - 5.80 Million/uL Final   Hemoglobin 08/10/2022 15.2  13.2 - 17.1 g/dL Final   HCT 78/29/5621 45.9  38.5 - 50.0 % Final   MCV 08/10/2022 89.3  80.0 - 100.0 fL Final   MCH 08/10/2022 29.6  27.0 - 33.0 pg Final   MCHC 08/10/2022 33.1  32.0 - 36.0 g/dL Final   RDW 30/86/5784 13.1  11.0 - 15.0 % Final   Platelets 08/10/2022 292  140 - 400 Thousand/uL Final   MPV 08/10/2022 9.6  7.5 - 12.5 fL Final   Neutro Abs 08/10/2022 5,464  1,500 - 7,800 cells/uL Final   Lymphs Abs 08/10/2022 1,923  850 - 3,900 cells/uL Final   Absolute Monocytes 08/10/2022 861  200 - 950 cells/uL Final   Eosinophils Absolute 08/10/2022 400  15 - 500 cells/uL Final   Basophils Absolute 08/10/2022 52  0 - 200 cells/uL Final   Neutrophils Relative % 08/10/2022 62.8  % Final   Total Lymphocyte 08/10/2022 22.1  % Final   Monocytes Relative 08/10/2022 9.9  % Final   Eosinophils Relative  08/10/2022 4.6  % Final   Basophils Relative 08/10/2022 0.6  % Final   Glucose, Bld 08/10/2022 111 (H)  65 - 99 mg/dL Final   Comment: .            Fasting reference interval . For someone without known diabetes, a glucose value between 100 and 125 mg/dL is consistent with prediabetes and should be confirmed with a follow-up test. .    BUN 08/10/2022 14  7 - 25 mg/dL Final   Creat 69/62/9528 0.86  0.70 - 1.28 mg/dL Final   eGFR 41/32/4401 93  > OR = 60 mL/min/1.59m2 Final   BUN/Creatinine Ratio 08/10/2022 SEE NOTE:  6 - 22 (calc) Final   Comment:    Not Reported: BUN and Creatinine are within    reference range. .    Sodium 08/10/2022 141  135 - 146 mmol/L Final   Potassium 08/10/2022 4.2  3.5 - 5.3 mmol/L Final   Chloride 08/10/2022 102  98 - 110 mmol/L Final   CO2 08/10/2022 28  20 - 32 mmol/L Final   Calcium 08/10/2022 9.1  8.6 - 10.3 mg/dL Final   Total Protein 02/72/5366 7.1  6.1 - 8.1 g/dL Final   Albumin 44/06/4740 4.4  3.6 - 5.1 g/dL Final   Globulin 59/56/3875 2.7  1.9 - 3.7 g/dL (calc) Final   AG Ratio 08/10/2022 1.6  1.0 - 2.5 (  calc) Final   Total Bilirubin 08/10/2022 0.8  0.2 - 1.2 mg/dL Final   Alkaline phosphatase (APISO) 08/10/2022 89  35 - 144 U/L Final   AST 08/10/2022 30  10 - 35 U/L Final   ALT 08/10/2022 25  9 - 46 U/L Final   Cholesterol 08/10/2022 128  <200 mg/dL Final   HDL 16/01/9603 37 (L)  > OR = 40 mg/dL Final   Triglycerides 54/12/8117 218 (H)  <150 mg/dL Final   Comment: . If a non-fasting specimen was collected, consider repeat triglyceride testing on a fasting specimen if clinically indicated.  Perry Mount et al. J. of Clin. Lipidol. 2015;9:129-169. Marland Kitchen    LDL Cholesterol (Calc) 08/10/2022 62  mg/dL (calc) Final   Comment: Reference range: <100 . Desirable range <100 mg/dL for primary prevention;   <70 mg/dL for patients with CHD or diabetic patients  with > or = 2 CHD risk factors. Marland Kitchen LDL-C is now calculated using the Martin-Hopkins   calculation, which is a validated novel method providing  better accuracy than the Friedewald equation in the  estimation of LDL-C.  Horald Pollen et al. Lenox Ahr. 1478;295(62): 2061-2068  (http://education.[facility].com/faq/FAQ164)    Total CHOL/HDL Ratio 08/10/2022 3.5  <1.3 (calc) Final   Non-HDL Cholesterol (Calc) 08/10/2022 91  <130 mg/dL (calc) Final   Comment: For patients with diabetes plus 1 major ASCVD risk  factor, treating to a non-HDL-C goal of <100 mg/dL  (LDL-C of <08 mg/dL) is considered a therapeutic  option.    PSA 08/10/2022 1.56  < OR = 4.00 ng/mL Final   Comment: The total PSA value from this assay system is  standardized against the WHO standard. The test  result will be approximately 20% lower when compared  to the equimolar-standardized total PSA (Beckman  Coulter). Comparison of serial PSA results should be  interpreted with this fact in mind. . This test was performed using the Siemens  chemiluminescent method. Values obtained from  different assay methods cannot be used interchangeably. PSA levels, regardless of value, should not be interpreted as absolute evidence of the presence or absence of disease.      Past Medical History:  Diagnosis Date   Macular degeneration    Past Surgical History:  Procedure Laterality Date   APPENDECTOMY  1958   ROTATOR CUFF REPAIR  07/07/2010   reattach Bicep also   Current Outpatient Medications on File Prior to Visit  Medication Sig Dispense Refill   allopurinol (ZYLOPRIM) 300 MG tablet TAKE 1 TABLET EVERY DAY 90 tablet 2   diphenhydrAMINE (BENADRYL) 25 MG tablet Take 25 mg by mouth every 6 (six) hours as needed for allergies.     gemfibrozil (LOPID) 600 MG tablet TAKE 1 TABLET EVERY DAY 30 tablet 1   Multiple Vitamin (MULTIVITAMIN) tablet Take 1 tablet by mouth daily with supper.     Multiple Vitamins-Minerals (ICAPS) CAPS Take 2 capsules by mouth 2 (two) times daily.     OVER THE COUNTER MEDICATION  Collengula     OVER THE COUNTER MEDICATION Blueberry     Turmeric 400 MG CAPS Take by mouth.     VASCEPA 1 g capsule TAKE 2 CAPSULES TWICE DAILY 360 capsule 3   No current facility-administered medications on file prior to visit.   No Known Allergies Social History   Socioeconomic History   Marital status: Married    Spouse name: Not on file   Number of children: Not on file   Years of education: Not on file  Highest education level: Not on file  Occupational History   Not on file  Tobacco Use   Smoking status: Never   Smokeless tobacco: Never  Substance and Sexual Activity   Alcohol use: No   Drug use: No   Sexual activity: Not Currently  Other Topics Concern   Not on file  Social History Narrative   Not on file   Social Determinants of Health   Financial Resource Strain: Not on file  Food Insecurity: Not on file  Transportation Needs: Not on file  Physical Activity: Not on file  Stress: Not on file  Social Connections: Not on file  Intimate Partner Violence: Not on file   Family History  Problem Relation Age of Onset   Hypertension Mother    Macular degeneration Mother    Cancer Father    Macular degeneration Sister    Early death Paternal Grandfather    Tuberculosis Paternal Grandfather      Review of Systems  All other systems reviewed and are negative.      Objective:   Physical Exam Vitals reviewed.  Constitutional:      General: He is not in acute distress.    Appearance: He is well-developed.  HENT:     Head: Normocephalic and atraumatic.     Right Ear: External ear normal.     Left Ear: External ear normal.     Nose: Nose normal.     Mouth/Throat:     Pharynx: No oropharyngeal exudate.  Eyes:     General: No scleral icterus.       Left eye: No discharge.     Conjunctiva/sclera: Conjunctivae normal.     Pupils: Pupils are equal, round, and reactive to light.  Neck:     Thyroid: No thyromegaly.     Vascular: No JVD.     Trachea: No  tracheal deviation.  Cardiovascular:     Rate and Rhythm: Normal rate and regular rhythm.     Heart sounds: Normal heart sounds. No murmur heard.    No friction rub. No gallop.  Pulmonary:     Effort: Pulmonary effort is normal. No respiratory distress.     Breath sounds: Normal breath sounds. No stridor. No wheezing or rales.  Chest:     Chest wall: No tenderness.  Abdominal:     General: Bowel sounds are normal. There is no distension.     Palpations: Abdomen is soft. There is no mass.     Tenderness: There is no abdominal tenderness. There is no guarding or rebound.  Musculoskeletal:        General: No tenderness. Normal range of motion.     Cervical back: Normal range of motion and neck supple.  Lymphadenopathy:     Cervical: No cervical adenopathy.  Skin:    General: Skin is warm.     Coloration: Skin is not pale.     Findings: No erythema or rash.  Neurological:     Mental Status: He is alert and oriented to person, place, and time.     Cranial Nerves: No cranial nerve deficit.     Motor: No abnormal muscle tone.     Coordination: Coordination normal.     Deep Tendon Reflexes: Reflexes are normal and symmetric.  Psychiatric:        Behavior: Behavior normal.        Thought Content: Thought content normal.        Judgment: Judgment normal.  Assessment & Plan:  Routine general medical examination at a health care facility  Hypertriglyceridemia  Metabolic syndrome He denies any falls except for warning.  He was tripped by his dog but this was a reasonable fall that could have happened to anyone.  He denies any memory loss.  He denies any depression.  His blood pressure today is excellent.  He has history of bilateral, continue to urged diet exercise weight loss.  His triglycerides are about as good as they can be expected to be.  His triglycerides been over 1500 in the past however the gemfibrozil and Vascepa seem to be working well as today they are 218.   Recommended a low-carb diet.  Immunizations are up-to-date.  Patient is due for colonoscopy this year but he states that he will contact the office to schedule that.  PSA is excellent

## 2022-08-30 DIAGNOSIS — M9903 Segmental and somatic dysfunction of lumbar region: Secondary | ICD-10-CM | POA: Diagnosis not present

## 2022-08-30 DIAGNOSIS — M9904 Segmental and somatic dysfunction of sacral region: Secondary | ICD-10-CM | POA: Diagnosis not present

## 2022-08-30 DIAGNOSIS — M9901 Segmental and somatic dysfunction of cervical region: Secondary | ICD-10-CM | POA: Diagnosis not present

## 2022-08-30 DIAGNOSIS — M9902 Segmental and somatic dysfunction of thoracic region: Secondary | ICD-10-CM | POA: Diagnosis not present

## 2022-09-10 DIAGNOSIS — H353211 Exudative age-related macular degeneration, right eye, with active choroidal neovascularization: Secondary | ICD-10-CM | POA: Diagnosis not present

## 2022-09-12 ENCOUNTER — Other Ambulatory Visit: Payer: Self-pay | Admitting: Family Medicine

## 2022-09-13 DIAGNOSIS — M9904 Segmental and somatic dysfunction of sacral region: Secondary | ICD-10-CM | POA: Diagnosis not present

## 2022-09-13 DIAGNOSIS — M9903 Segmental and somatic dysfunction of lumbar region: Secondary | ICD-10-CM | POA: Diagnosis not present

## 2022-09-13 DIAGNOSIS — M9902 Segmental and somatic dysfunction of thoracic region: Secondary | ICD-10-CM | POA: Diagnosis not present

## 2022-09-13 DIAGNOSIS — M9901 Segmental and somatic dysfunction of cervical region: Secondary | ICD-10-CM | POA: Diagnosis not present

## 2022-09-13 NOTE — Telephone Encounter (Signed)
Requested Prescriptions  Pending Prescriptions Disp Refills   allopurinol (ZYLOPRIM) 300 MG tablet [Pharmacy Med Name: ALLOPURINOL 300 MG Tablet] 90 tablet 0    Sig: TAKE 1 TABLET EVERY DAY     Endocrinology:  Gout Agents - allopurinol Failed - 09/12/2022  4:52 PM      Failed - Uric Acid in normal range and within 360 days    Uric Acid, Serum  Date Value Ref Range Status  04/12/2017 5.2 4.0 - 8.0 mg/dL Final    Comment:    Therapeutic target for gout patients: <6.0 mg/dL .          Failed - Valid encounter within last 12 months    Recent Outpatient Visits           1 year ago Routine general medical examination at a health care facility   [facility] Medicine Pickard, Priscille Heidelberg, MD   1 year ago Viral upper respiratory tract infection   [facility] Medicine Valentino Nose, NP   2 years ago Routine general medical examination at a health care facility   [facility] Medicine Pickard, Priscille Heidelberg, MD   2 years ago Viral upper respiratory tract infection   [facility] Donita Brooks, MD   3 years ago Routine general medical examination at a health care facility   [facility] Pickard, Priscille Heidelberg, MD              Passed - Cr in normal range and within 360 days    Creat  Date Value Ref Range Status  08/10/2022 0.86 0.70 - 1.28 mg/dL Final         Passed - CBC within normal limits and completed in the last 12 months    WBC  Date Value Ref Range Status  08/10/2022 8.7 3.8 - 10.8 Thousand/uL Final   RBC  Date Value Ref Range Status  08/10/2022 5.14 4.20 - 5.80 Million/uL Final   Hemoglobin  Date Value Ref Range Status  08/10/2022 15.2 13.2 - 17.1 g/dL Final   HCT  Date Value Ref Range Status  08/10/2022 45.9 38.5 - 50.0 % Final   MCHC  Date Value Ref Range Status  08/10/2022 33.1 32.0 - 36.0 g/dL Final   [facility]  Date Value Ref Range Status  08/10/2022 29.6 27.0 - 33.0 pg Final   MCV  Date Value Ref Range  Status  08/10/2022 89.3 80.0 - 100.0 fL Final   No results found for: "PLTCOUNTKUC", "LABPLAT", "POCPLA" RDW  Date Value Ref Range Status  08/10/2022 13.1 11.0 - 15.0 % Final

## 2022-09-17 ENCOUNTER — Other Ambulatory Visit: Payer: Self-pay | Admitting: Family Medicine

## 2022-09-18 NOTE — Telephone Encounter (Signed)
Requested Prescriptions  Pending Prescriptions Disp Refills   gemfibrozil (LOPID) 600 MG tablet [Pharmacy Med Name: GEMFIBROZIL 600 MG Tablet] 60 tablet 5    Sig: TAKE 1 TABLET EVERY DAY     Cardiovascular:  Antilipid - Fibric Acid Derivatives Failed - 09/17/2022  1:57 AM      Failed - Valid encounter within last 12 months    Recent Outpatient Visits           1 year ago Routine general medical examination at a health care facility   [facility] Pickard, Priscille Heidelberg, MD   1 year ago Viral upper respiratory tract infection   [facility] Valentino Nose, NP   2 years ago Routine general medical examination at a health care facility   [facility] Pickard, Priscille Heidelberg, MD   2 years ago Viral upper respiratory tract infection   [facility] Tanya Nones, Priscille Heidelberg, MD   3 years ago Routine general medical examination at a health care facility   [facility] Pickard, Priscille Heidelberg, MD              Failed - Lipid Panel in normal range within the last 12 months    Cholesterol  Date Value Ref Range Status  08/10/2022 128 <200 mg/dL Final   LDL Cholesterol (Calc)  Date Value Ref Range Status  08/10/2022 62 mg/dL (calc) Final    Comment:    Reference range: <100 . Desirable range <100 mg/dL for primary prevention;   <70 mg/dL for patients with CHD or diabetic patients  with > or = 2 CHD risk factors. Marland Kitchen LDL-C is now calculated using the Martin-Hopkins  calculation, which is a validated novel method providing  better accuracy than the Friedewald equation in the  estimation of LDL-C.  Horald Pollen et al. Lenox Ahr. 1610;960(45): 2061-2068  (http://education.[employer].com/faq/FAQ164)    HDL  Date Value Ref Range Status  08/10/2022 37 (L) > OR = 40 mg/dL Final   Triglycerides  Date Value Ref Range Status  08/10/2022 218 (H) <150 mg/dL Final    Comment:    . If a non-fasting specimen was collected,  consider repeat triglyceride testing on a fasting specimen if clinically indicated.  Perry Mount et al. J. of Clin. Lipidol. 2015;9:129-169. Marland Kitchen          Passed - ALT in normal range and within 360 days    ALT  Date Value Ref Range Status  08/10/2022 25 9 - 46 U/L Final         Passed - AST in normal range and within 360 days    AST  Date Value Ref Range Status  08/10/2022 30 10 - 35 U/L Final         Passed - Cr in normal range and within 360 days    Creat  Date Value Ref Range Status  08/10/2022 0.86 0.70 - 1.28 mg/dL Final         Passed - HGB in normal range and within 360 days    Hemoglobin  Date Value Ref Range Status  08/10/2022 15.2 13.2 - 17.1 g/dL Final         Passed - HCT in normal range and within 360 days    HCT  Date Value Ref Range Status  08/10/2022 45.9 38.5 - 50.0 % Final         Passed - PLT in normal range and within 360 days    Platelets  Date Value Ref Range Status  08/10/2022 292 140 - 400 Thousand/uL Final         Passed - WBC in normal range and within 360 days    WBC  Date Value Ref Range Status  08/10/2022 8.7 3.8 - 10.8 Thousand/uL Final         Passed - eGFR is 30 or above and within 360 days    GFR, Est African American  Date Value Ref Range Status  08/05/2020 94 > OR = 60 mL/min/1.60m2 Final   GFR, Est Non African American  Date Value Ref Range Status  08/05/2020 81 > OR = 60 mL/min/1.29m2 Final   eGFR  Date Value Ref Range Status  08/10/2022 93 > OR = 60 mL/min/1.28m2 Final

## 2022-09-27 DIAGNOSIS — M9901 Segmental and somatic dysfunction of cervical region: Secondary | ICD-10-CM | POA: Diagnosis not present

## 2022-09-27 DIAGNOSIS — M9902 Segmental and somatic dysfunction of thoracic region: Secondary | ICD-10-CM | POA: Diagnosis not present

## 2022-09-27 DIAGNOSIS — M9903 Segmental and somatic dysfunction of lumbar region: Secondary | ICD-10-CM | POA: Diagnosis not present

## 2022-09-27 DIAGNOSIS — M9904 Segmental and somatic dysfunction of sacral region: Secondary | ICD-10-CM | POA: Diagnosis not present

## 2022-10-10 DIAGNOSIS — M9903 Segmental and somatic dysfunction of lumbar region: Secondary | ICD-10-CM | POA: Diagnosis not present

## 2022-10-10 DIAGNOSIS — M9904 Segmental and somatic dysfunction of sacral region: Secondary | ICD-10-CM | POA: Diagnosis not present

## 2022-10-10 DIAGNOSIS — M9901 Segmental and somatic dysfunction of cervical region: Secondary | ICD-10-CM | POA: Diagnosis not present

## 2022-10-10 DIAGNOSIS — M9902 Segmental and somatic dysfunction of thoracic region: Secondary | ICD-10-CM | POA: Diagnosis not present

## 2022-10-25 DIAGNOSIS — M9903 Segmental and somatic dysfunction of lumbar region: Secondary | ICD-10-CM | POA: Diagnosis not present

## 2022-10-25 DIAGNOSIS — M9904 Segmental and somatic dysfunction of sacral region: Secondary | ICD-10-CM | POA: Diagnosis not present

## 2022-10-25 DIAGNOSIS — M9901 Segmental and somatic dysfunction of cervical region: Secondary | ICD-10-CM | POA: Diagnosis not present

## 2022-10-25 DIAGNOSIS — M9902 Segmental and somatic dysfunction of thoracic region: Secondary | ICD-10-CM | POA: Diagnosis not present

## 2022-11-08 DIAGNOSIS — M9904 Segmental and somatic dysfunction of sacral region: Secondary | ICD-10-CM | POA: Diagnosis not present

## 2022-11-08 DIAGNOSIS — M9903 Segmental and somatic dysfunction of lumbar region: Secondary | ICD-10-CM | POA: Diagnosis not present

## 2022-11-08 DIAGNOSIS — M9901 Segmental and somatic dysfunction of cervical region: Secondary | ICD-10-CM | POA: Diagnosis not present

## 2022-11-08 DIAGNOSIS — M9902 Segmental and somatic dysfunction of thoracic region: Secondary | ICD-10-CM | POA: Diagnosis not present

## 2022-11-22 DIAGNOSIS — M9904 Segmental and somatic dysfunction of sacral region: Secondary | ICD-10-CM | POA: Diagnosis not present

## 2022-11-22 DIAGNOSIS — M9903 Segmental and somatic dysfunction of lumbar region: Secondary | ICD-10-CM | POA: Diagnosis not present

## 2022-11-22 DIAGNOSIS — M9901 Segmental and somatic dysfunction of cervical region: Secondary | ICD-10-CM | POA: Diagnosis not present

## 2022-11-22 DIAGNOSIS — M9902 Segmental and somatic dysfunction of thoracic region: Secondary | ICD-10-CM | POA: Diagnosis not present

## 2022-11-26 ENCOUNTER — Other Ambulatory Visit: Payer: Self-pay | Admitting: Family Medicine

## 2022-11-27 NOTE — Telephone Encounter (Signed)
OV 08/16/22 Requested Prescriptions  Pending Prescriptions Disp Refills   allopurinol (ZYLOPRIM) 300 MG tablet [Pharmacy Med Name: ALLOPURINOL 300 MG Tablet] 90 tablet 3    Sig: TAKE 1 TABLET EVERY DAY     Endocrinology:  Gout Agents - allopurinol Failed - 11/26/2022  2:40 AM      Failed - Uric Acid in normal range and within 360 days    Uric Acid, Serum  Date Value Ref Range Status  04/12/2017 5.2 4.0 - 8.0 mg/dL Final    Comment:    Therapeutic target for gout patients: <6.0 mg/dL .          Failed - Valid encounter within last 12 months    Recent Outpatient Visits           1 year ago Routine general medical examination at a health care facility   [facility] Pickard, Priscille Heidelberg, MD   1 year ago Viral upper respiratory tract infection   [facility] Medicine Valentino Nose, NP   2 years ago Routine general medical examination at a health care facility   [facility] Medicine Pickard, Priscille Heidelberg, MD   2 years ago Viral upper respiratory tract infection   [facility] Medicine Donita Brooks, MD   3 years ago Routine general medical examination at a health care facility   [facility] Pickard, Priscille Heidelberg, MD              Passed - Cr in normal range and within 360 days    Creat  Date Value Ref Range Status  08/10/2022 0.86 0.70 - 1.28 mg/dL Final         Passed - CBC within normal limits and completed in the last 12 months    WBC  Date Value Ref Range Status  08/10/2022 8.7 3.8 - 10.8 Thousand/uL Final   RBC  Date Value Ref Range Status  08/10/2022 5.14 4.20 - 5.80 Million/uL Final   Hemoglobin  Date Value Ref Range Status  08/10/2022 15.2 13.2 - 17.1 g/dL Final   HCT  Date Value Ref Range Status  08/10/2022 45.9 38.5 - 50.0 % Final   MCHC  Date Value Ref Range Status  08/10/2022 33.1 32.0 - 36.0 g/dL Final   [facility]  Date Value Ref Range Status  08/10/2022 29.6 27.0 - 33.0 pg Final   MCV  Date  Value Ref Range Status  08/10/2022 89.3 80.0 - 100.0 fL Final   No results found for: "PLTCOUNTKUC", "LABPLAT", "POCPLA" RDW  Date Value Ref Range Status  08/10/2022 13.1 11.0 - 15.0 % Final

## 2022-12-06 DIAGNOSIS — M9903 Segmental and somatic dysfunction of lumbar region: Secondary | ICD-10-CM | POA: Diagnosis not present

## 2022-12-06 DIAGNOSIS — M9902 Segmental and somatic dysfunction of thoracic region: Secondary | ICD-10-CM | POA: Diagnosis not present

## 2022-12-06 DIAGNOSIS — M9901 Segmental and somatic dysfunction of cervical region: Secondary | ICD-10-CM | POA: Diagnosis not present

## 2022-12-06 DIAGNOSIS — M9904 Segmental and somatic dysfunction of sacral region: Secondary | ICD-10-CM | POA: Diagnosis not present

## 2022-12-17 DIAGNOSIS — H2513 Age-related nuclear cataract, bilateral: Secondary | ICD-10-CM | POA: Diagnosis not present

## 2022-12-17 DIAGNOSIS — H353211 Exudative age-related macular degeneration, right eye, with active choroidal neovascularization: Secondary | ICD-10-CM | POA: Diagnosis not present

## 2022-12-17 DIAGNOSIS — H353222 Exudative age-related macular degeneration, left eye, with inactive choroidal neovascularization: Secondary | ICD-10-CM | POA: Diagnosis not present

## 2022-12-20 DIAGNOSIS — M9901 Segmental and somatic dysfunction of cervical region: Secondary | ICD-10-CM | POA: Diagnosis not present

## 2022-12-20 DIAGNOSIS — M9904 Segmental and somatic dysfunction of sacral region: Secondary | ICD-10-CM | POA: Diagnosis not present

## 2022-12-20 DIAGNOSIS — M9903 Segmental and somatic dysfunction of lumbar region: Secondary | ICD-10-CM | POA: Diagnosis not present

## 2022-12-20 DIAGNOSIS — M9902 Segmental and somatic dysfunction of thoracic region: Secondary | ICD-10-CM | POA: Diagnosis not present

## 2023-01-03 DIAGNOSIS — H353211 Exudative age-related macular degeneration, right eye, with active choroidal neovascularization: Secondary | ICD-10-CM | POA: Diagnosis not present

## 2023-01-13 ENCOUNTER — Other Ambulatory Visit: Payer: Self-pay | Admitting: Family Medicine

## 2023-01-17 DIAGNOSIS — M9902 Segmental and somatic dysfunction of thoracic region: Secondary | ICD-10-CM | POA: Diagnosis not present

## 2023-01-17 DIAGNOSIS — M9903 Segmental and somatic dysfunction of lumbar region: Secondary | ICD-10-CM | POA: Diagnosis not present

## 2023-01-17 DIAGNOSIS — M9901 Segmental and somatic dysfunction of cervical region: Secondary | ICD-10-CM | POA: Diagnosis not present

## 2023-01-17 DIAGNOSIS — M9904 Segmental and somatic dysfunction of sacral region: Secondary | ICD-10-CM | POA: Diagnosis not present

## 2023-01-22 ENCOUNTER — Ambulatory Visit
Admission: RE | Admit: 2023-01-22 | Discharge: 2023-01-22 | Disposition: A | Discharge status: Home / Self Care | Payer: Medicare HMO | Source: Ambulatory Visit | Attending: Ophthalmology | Admitting: Ophthalmology

## 2023-01-22 ENCOUNTER — Other Ambulatory Visit: Payer: Self-pay | Admitting: Ophthalmology

## 2023-01-22 ENCOUNTER — Other Ambulatory Visit
Admission: RE | Admit: 2023-01-22 | Discharge: 2023-01-22 | Disposition: A | Discharge status: Home / Self Care | Payer: Medicare HMO | Source: Ambulatory Visit | Attending: Ophthalmology | Admitting: Ophthalmology

## 2023-01-22 DIAGNOSIS — H2010 Chronic iridocyclitis, unspecified eye: Secondary | ICD-10-CM | POA: Diagnosis not present

## 2023-01-22 DIAGNOSIS — D869 Sarcoidosis, unspecified: Secondary | ICD-10-CM | POA: Insufficient documentation

## 2023-01-22 DIAGNOSIS — Z201 Contact with and (suspected) exposure to tuberculosis: Secondary | ICD-10-CM | POA: Diagnosis present

## 2023-01-22 DIAGNOSIS — H3091 Unspecified chorioretinal inflammation, right eye: Secondary | ICD-10-CM | POA: Diagnosis not present

## 2023-01-22 DIAGNOSIS — H2513 Age-related nuclear cataract, bilateral: Secondary | ICD-10-CM | POA: Diagnosis not present

## 2023-01-22 LAB — SEDIMENTATION RATE: Sed Rate: 10 mm/h (ref 0–20)

## 2023-01-23 LAB — RPR: RPR Ser Ql: NONREACTIVE

## 2023-01-23 LAB — ANGIOTENSIN CONVERTING ENZYME: Angiotensin-Converting Enzyme: 26 U/L (ref 14–82)

## 2023-01-23 LAB — C-REACTIVE PROTEIN: CRP: 1.6 mg/dL — ABNORMAL HIGH (ref <1.0)

## 2023-01-23 LAB — RHEUMATOID FACTOR: Rheumatoid fact SerPl-aCnc: <10 [IU]/mL (ref <14.0)

## 2023-01-23 LAB — ANCA PROFILE
Anti-MPO Antibodies: <0.2 U (ref 0.0–0.9)
Anti-PR3 Antibodies: <0.2 U (ref 0.0–0.9)
Atypical P-ANCA titer: <1:20 {titer}
C-ANCA: <1:20 {titer}
P-ANCA: <1:20 {titer}

## 2023-01-23 LAB — ANA: Anti Nuclear Antibody (ANA): NEGATIVE

## 2023-01-25 LAB — QUANTIFERON-TB GOLD PLUS: QuantiFERON-TB Gold Plus: NEGATIVE

## 2023-01-25 LAB — QUANTIFERON-TB GOLD PLUS (RQFGPL)
QuantiFERON Mitogen Value: >10 [IU]/mL
QuantiFERON Nil Value: 0.04 [IU]/mL
QuantiFERON TB1 Ag Value: 0.03 [IU]/mL
QuantiFERON TB2 Ag Value: 0.04 [IU]/mL

## 2023-01-25 LAB — HLA-B27 ANTIGEN: HLA-B27: NEGATIVE

## 2023-01-31 DIAGNOSIS — M9901 Segmental and somatic dysfunction of cervical region: Secondary | ICD-10-CM | POA: Diagnosis not present

## 2023-01-31 DIAGNOSIS — M9904 Segmental and somatic dysfunction of sacral region: Secondary | ICD-10-CM | POA: Diagnosis not present

## 2023-01-31 DIAGNOSIS — M9903 Segmental and somatic dysfunction of lumbar region: Secondary | ICD-10-CM | POA: Diagnosis not present

## 2023-01-31 DIAGNOSIS — M9902 Segmental and somatic dysfunction of thoracic region: Secondary | ICD-10-CM | POA: Diagnosis not present

## 2023-02-05 DIAGNOSIS — H353211 Exudative age-related macular degeneration, right eye, with active choroidal neovascularization: Secondary | ICD-10-CM | POA: Diagnosis not present

## 2023-02-05 DIAGNOSIS — H3091 Unspecified chorioretinal inflammation, right eye: Secondary | ICD-10-CM | POA: Diagnosis not present

## 2023-02-05 DIAGNOSIS — H2513 Age-related nuclear cataract, bilateral: Secondary | ICD-10-CM | POA: Diagnosis not present

## 2023-02-14 DIAGNOSIS — M9902 Segmental and somatic dysfunction of thoracic region: Secondary | ICD-10-CM | POA: Diagnosis not present

## 2023-02-14 DIAGNOSIS — M9904 Segmental and somatic dysfunction of sacral region: Secondary | ICD-10-CM | POA: Diagnosis not present

## 2023-02-14 DIAGNOSIS — M9903 Segmental and somatic dysfunction of lumbar region: Secondary | ICD-10-CM | POA: Diagnosis not present

## 2023-02-14 DIAGNOSIS — M9901 Segmental and somatic dysfunction of cervical region: Secondary | ICD-10-CM | POA: Diagnosis not present

## 2023-02-28 DIAGNOSIS — M9903 Segmental and somatic dysfunction of lumbar region: Secondary | ICD-10-CM | POA: Diagnosis not present

## 2023-02-28 DIAGNOSIS — M9901 Segmental and somatic dysfunction of cervical region: Secondary | ICD-10-CM | POA: Diagnosis not present

## 2023-02-28 DIAGNOSIS — M9902 Segmental and somatic dysfunction of thoracic region: Secondary | ICD-10-CM | POA: Diagnosis not present

## 2023-02-28 DIAGNOSIS — M9904 Segmental and somatic dysfunction of sacral region: Secondary | ICD-10-CM | POA: Diagnosis not present

## 2023-03-01 ENCOUNTER — Telehealth: Payer: Self-pay

## 2023-03-01 NOTE — Telephone Encounter (Unsigned)
Copied from CRM [id number]. Topic: Referral - Request for Referral >> Mar 01, 2023  9:34 AM Joanette Gula wrote: Did the patient discuss referral with their provider in the last year? Yes (If No - schedule appointment) (If Yes - send message)  Appointment offered? No  Type of order/referral and detailed reason for visit: pt stated that he need a Colonoscopy  Preference of office, provider, location: [facility] in [place]   If referral order, have you been seen by this specialty before? Yes (If Yes, this issue or another issue? When? Where?  Can we respond through MyChart? Yes

## 2023-03-02 ENCOUNTER — Other Ambulatory Visit: Payer: Self-pay | Admitting: Family Medicine

## 2023-03-02 DIAGNOSIS — Z1211 Encounter for screening for malignant neoplasm of colon: Secondary | ICD-10-CM

## 2023-03-04 ENCOUNTER — Other Ambulatory Visit: Payer: Self-pay

## 2023-03-04 ENCOUNTER — Telehealth: Payer: Self-pay

## 2023-03-04 DIAGNOSIS — Z860100 Personal history of colon polyps, unspecified: Secondary | ICD-10-CM

## 2023-03-04 DIAGNOSIS — Z800 Family history of malignant neoplasm of digestive organs: Secondary | ICD-10-CM

## 2023-03-04 MED ORDER — NA SULFATE-K SULFATE-MG SULF 17.5-3.13-1.6 GM/177ML PO SOLN
1.0000 | Freq: Once | ORAL | 0 refills | Status: AC
Start: 2023-03-04 — End: 2023-03-04

## 2023-03-04 NOTE — Telephone Encounter (Signed)
Gastroenterology Pre-Procedure Review  Request Date: 03/28/23 Requesting Physician: Dr. Servando Snare  PATIENT REVIEW QUESTIONS: The patient's wife responded to the following health history questions as indicated:    1. Are you having any GI issues? no 2. Do you have a personal history of Polyps? yes (last colonoscopy performed with [facility] 02/07/2018) 3. Do you have a family history of Colon Cancer or Polyps? Father colon cancer 4. Diabetes Mellitus? no 5. Joint replacements in the past 12 months?no 6. Major health problems in the past 3 months?no 7. Any artificial heart valves, MVP, or defibrillator?no    MEDICATIONS & ALLERGIES:    Patient reports the following regarding taking any anticoagulation/antiplatelet therapy:   Plavix, Coumadin, Eliquis, Xarelto, Lovenox, Pradaxa, Brilinta, or Effient? no Aspirin? no  Patient confirms/reports the following medications:  Current Outpatient Medications  Medication Sig Dispense Refill   allopurinol (ZYLOPRIM) 300 MG tablet TAKE 1 TABLET EVERY DAY 90 tablet 2   diphenhydrAMINE (BENADRYL) 25 MG tablet Take 25 mg by mouth every 6 (six) hours as needed for allergies.     gemfibrozil (LOPID) 600 MG tablet TAKE 1 TABLET EVERY DAY 90 tablet 3   Multiple Vitamin (MULTIVITAMIN) tablet Take 1 tablet by mouth daily with supper.     Multiple Vitamins-Minerals (ICAPS) CAPS Take 2 capsules by mouth 2 (two) times daily.     OVER THE COUNTER MEDICATION Collengula     OVER THE COUNTER MEDICATION Blueberry     Turmeric 400 MG CAPS Take by mouth.     VASCEPA 1 g capsule TAKE 2 CAPSULES TWICE DAILY 360 capsule 3   No current facility-administered medications for this visit.    Patient confirms/reports the following allergies:  No Known Allergies  No orders of the defined types were placed in this encounter.   AUTHORIZATION INFORMATION Primary Insurance: 1D#: Group #:  Secondary Insurance: 1D#: Group #:  SCHEDULE INFORMATION: Date:  03/28/23 Time: Location: [facility]

## 2023-03-11 DIAGNOSIS — H353211 Exudative age-related macular degeneration, right eye, with active choroidal neovascularization: Secondary | ICD-10-CM | POA: Diagnosis not present

## 2023-03-14 DIAGNOSIS — M9903 Segmental and somatic dysfunction of lumbar region: Secondary | ICD-10-CM | POA: Diagnosis not present

## 2023-03-14 DIAGNOSIS — M9901 Segmental and somatic dysfunction of cervical region: Secondary | ICD-10-CM | POA: Diagnosis not present

## 2023-03-14 DIAGNOSIS — M9902 Segmental and somatic dysfunction of thoracic region: Secondary | ICD-10-CM | POA: Diagnosis not present

## 2023-03-14 DIAGNOSIS — M9904 Segmental and somatic dysfunction of sacral region: Secondary | ICD-10-CM | POA: Diagnosis not present

## 2023-03-28 ENCOUNTER — Ambulatory Visit: Payer: Medicare HMO | Admitting: Anesthesiology

## 2023-03-28 ENCOUNTER — Encounter: Payer: Self-pay | Admitting: Gastroenterology

## 2023-03-28 ENCOUNTER — Ambulatory Visit
Admission: RE | Admit: 2023-03-28 | Discharge: 2023-03-28 | Disposition: A | Discharge status: Home / Self Care | Payer: Medicare HMO | Attending: Gastroenterology | Admitting: Gastroenterology

## 2023-03-28 ENCOUNTER — Encounter
Admission: RE | Disposition: A | Discharge status: Home / Self Care | Payer: Self-pay | Source: Home / Self Care | Attending: Gastroenterology

## 2023-03-28 DIAGNOSIS — Z1211 Encounter for screening for malignant neoplasm of colon: Secondary | ICD-10-CM | POA: Insufficient documentation

## 2023-03-28 DIAGNOSIS — K5730 Diverticulosis of large intestine without perforation or abscess without bleeding: Secondary | ICD-10-CM | POA: Insufficient documentation

## 2023-03-28 DIAGNOSIS — Z860100 Personal history of colon polyps, unspecified: Secondary | ICD-10-CM | POA: Diagnosis not present

## 2023-03-28 DIAGNOSIS — K635 Polyp of colon: Secondary | ICD-10-CM | POA: Diagnosis not present

## 2023-03-28 DIAGNOSIS — Z800 Family history of malignant neoplasm of digestive organs: Secondary | ICD-10-CM

## 2023-03-28 DIAGNOSIS — K640 First degree hemorrhoids: Secondary | ICD-10-CM | POA: Diagnosis not present

## 2023-03-28 DIAGNOSIS — K6389 Other specified diseases of intestine: Secondary | ICD-10-CM

## 2023-03-28 HISTORY — DX: Pure hyperglyceridemia: E78.1

## 2023-03-28 HISTORY — PX: COLONOSCOPY WITH PROPOFOL: SHX5780

## 2023-03-28 HISTORY — DX: Gout, unspecified: M10.9

## 2023-03-28 HISTORY — PX: POLYPECTOMY: SHX5525

## 2023-03-28 SURGERY — COLONOSCOPY WITH PROPOFOL
Anesthesia: General

## 2023-03-28 MED ORDER — DEXMEDETOMIDINE HCL IN NACL 80 MCG/20ML IV SOLN
INTRAVENOUS | Status: DC | PRN
  Administered 2023-03-28: 4 ug via INTRAVENOUS

## 2023-03-28 MED ORDER — LIDOCAINE HCL (CARDIAC) PF 100 MG/5ML IV SOSY
PREFILLED_SYRINGE | INTRAVENOUS | Status: DC | PRN
  Administered 2023-03-28: 40 mg via INTRAVENOUS

## 2023-03-28 MED ORDER — PHENYLEPHRINE 80 MCG/ML (10ML) SYRINGE FOR IV PUSH (FOR BLOOD PRESSURE SUPPORT)
PREFILLED_SYRINGE | INTRAVENOUS | Status: DC | PRN
  Administered 2023-03-28: 160 ug via INTRAVENOUS

## 2023-03-28 MED ORDER — PROPOFOL 500 MG/50ML IV EMUL
INTRAVENOUS | Status: DC | PRN
  Administered 2023-03-28: 125 ug/kg/min via INTRAVENOUS
  Administered 2023-03-28: 90 mg via INTRAVENOUS

## 2023-03-28 MED ORDER — PROPOFOL 1000 MG/100ML IV EMUL
INTRAVENOUS | Status: AC
  Filled 2023-03-28: qty 100

## 2023-03-28 MED ORDER — SODIUM CHLORIDE 0.9 % IV SOLN: INTRAVENOUS | Status: DC

## 2023-03-28 NOTE — Anesthesia Procedure Notes (Signed)
Date/Time: 03/28/2023 9:26 AM  Performed by: Stormy Fabian, CRNAPre-anesthesia Checklist: Patient identified, Emergency Drugs available, Suction available and Patient being monitored Patient Re-evaluated:Patient Re-evaluated prior to induction Oxygen Delivery Method: Nasal cannula Induction Type: IV induction Dental Injury: Teeth and Oropharynx as per pre-operative assessment  Comments: Nasal cannula with etCO2 monitoring

## 2023-03-28 NOTE — Transfer of Care (Signed)
Immediate Anesthesia Transfer of Care Note  Patient: Joshua Trevino.  Procedure(s) Performed: Procedure(s): COLONOSCOPY WITH PROPOFOL (N/A) POLYPECTOMY  Patient Location: PACU and Endoscopy Unit  Anesthesia Type:General  Level of Consciousness: sedated  Airway & Oxygen Therapy: Patient Spontanous Breathing and Patient connected to nasal cannula oxygen  Post-op Assessment: Report given to RN and Post -op Vital signs reviewed and stable  Post vital signs: Reviewed and stable  Last Vitals:  Vitals:   03/28/23 0913 03/28/23 0946  BP: 135/82 (!) 94/42  Pulse: 78 81  Resp: 18 19  Temp:    SpO2: 97% 91%    Complications: No apparent anesthesia complications

## 2023-03-28 NOTE — Anesthesia Postprocedure Evaluation (Signed)
Anesthesia Post Note  Patient: [name].  Procedure(s) Performed: COLONOSCOPY WITH PROPOFOL POLYPECTOMY  Patient location during evaluation: Endoscopy Anesthesia Type: General Level of consciousness: awake and alert Pain management: pain level controlled Vital Signs Assessment: post-procedure vital signs reviewed and stable Respiratory status: spontaneous breathing, nonlabored ventilation, respiratory function stable and patient connected to nasal cannula oxygen Cardiovascular status: blood pressure returned to baseline and stable Postop Assessment: no apparent nausea or vomiting Anesthetic complications: no   No notable events documented.   Last Vitals:  Vitals:   03/28/23 1000 03/28/23 1005  BP:  (!) 108/92  Pulse: 71 73  Resp: 20 16  Temp:    SpO2: 97% 98%    Last Pain:  Vitals:   03/28/23 0946  TempSrc: Temporal                 Cleda Mccreedy Aubryanna Nesheim

## 2023-03-28 NOTE — Anesthesia Preprocedure Evaluation (Signed)
Anesthesia Evaluation  Patient identified by MRN, date of birth, ID band Patient awake    Reviewed: Allergy & Precautions, NPO status , Patient's Chart, lab work & pertinent test results  History of Anesthesia Complications Negative for: history of anesthetic complications  Airway Mallampati: III  TM Distance: <3 FB Neck ROM: full    Dental  (+) Chipped   Pulmonary neg pulmonary ROS, neg shortness of breath   Pulmonary exam normal        Cardiovascular Exercise Tolerance: Good (-) angina (-) Past MI negative cardio ROS Normal cardiovascular exam     Neuro/Psych negative neurological ROS  negative psych ROS   GI/Hepatic negative GI ROS, Neg liver ROS,neg GERD  ,,  Endo/Other  negative endocrine ROS    Renal/GU negative Renal ROS  negative genitourinary   Musculoskeletal   Abdominal   Peds  Hematology negative hematology ROS (+)   Anesthesia Other Findings Past Medical History: No date: Hypertriglyceridemia No date: Macular degeneration  Past Surgical History: 04/09/1956: APPENDECTOMY No date: COLONOSCOPY WITH PROPOFOL     Comment:  x2 07/07/2010: ROTATOR CUFF REPAIR     Comment:  reattach Bicep also  BMI    Body Mass Index: 31.17 kg/m      Reproductive/Obstetrics negative OB ROS                             Anesthesia Physical Anesthesia Plan  ASA: 2  Anesthesia Plan: General   Post-op Pain Management:    Induction: Intravenous  PONV Risk Score and Plan: Propofol infusion and TIVA  Airway Management Planned: Natural Airway and Nasal Cannula  Additional Equipment:   Intra-op Plan:   Post-operative Plan:   Informed Consent: I have reviewed the patients History and Physical, chart, labs and discussed the procedure including the risks, benefits and alternatives for the proposed anesthesia with the patient or authorized representative who has indicated his/her  understanding and acceptance.     Dental Advisory Given  Plan Discussed with: Anesthesiologist, CRNA and Surgeon  Anesthesia Plan Comments: (Patient consented for risks of anesthesia including but not limited to:  - adverse reactions to medications - risk of airway placement if required - damage to eyes, teeth, lips or other oral mucosa - nerve damage due to positioning  - sore throat or hoarseness - Damage to heart, brain, nerves, lungs, other parts of body or loss of life  Patient voiced understanding and assent.)       Anesthesia Quick Evaluation

## 2023-03-28 NOTE — Op Note (Signed)
[facility] Gastroenterology Patient Name: Joshua Trevino Procedure Date: 03/28/2023 9:20 AM MRN: [id number] Account #: [id number] Date of Birth: November 03, 1950 Admit Type: Outpatient Age: 72 Room: [facility] ENDO ROOM 4 Gender: Male Note Status: Finalized Instrument Name: Prentice Docker [id number] Procedure:             Colonoscopy Indications:           High risk colon cancer surveillance: Personal history                         of colonic polyps Providers:             Midge Minium MD, MD Referring MD:          Priscille Heidelberg. Pickard (Referring MD) Medicines:             Propofol per Anesthesia Complications:         No immediate complications. Procedure:             Pre-Anesthesia Assessment:                        - Prior to the procedure, a History and Physical was                         performed, and patient medications and allergies were                         reviewed. The patient's tolerance of previous                         anesthesia was also reviewed. The risks and benefits                         of the procedure and the sedation options and risks                         were discussed with the patient. All questions were                         answered, and informed consent was obtained. Prior                         Anticoagulants: The patient has taken no anticoagulant                         or antiplatelet agents. ASA Grade Assessment: II - A                         patient with mild systemic disease. After reviewing                         the risks and benefits, the patient was deemed in                         satisfactory condition to undergo the procedure.                        After obtaining informed consent, the colonoscope was  passed under direct vision. Throughout the procedure,                         the patient's blood pressure, pulse, and oxygen                         saturations were monitored continuously. The                          Colonoscope was introduced through the anus and                         advanced to the the cecum, identified by appendiceal                         orifice and ileocecal valve. The colonoscopy was                         performed without difficulty. The patient tolerated                         the procedure well. The quality of the bowel                         preparation was excellent. Findings:      The perianal and digital rectal examinations were normal.      Two sessile polyps were found in the sigmoid colon. The polyps were 1 to       2 mm in size. These polyps were removed with a cold biopsy forceps.       Resection and retrieval were complete.      Non-bleeding internal hemorrhoids were found during retroflexion. The       hemorrhoids were Grade I (internal hemorrhoids that do not prolapse).      Multiple small-mouthed diverticula were found in the entire colon. Impression:            - Two 1 to 2 mm polyps in the sigmoid colon, removed                         with a cold biopsy forceps. Resected and retrieved.                        - Non-bleeding internal hemorrhoids.                        - Diverticulosis in the entire examined colon. Recommendation:        - Discharge patient to home.                        - Resume previous diet.                        - Continue present medications.                        - Await pathology results. Procedure Code(s):     --- Professional ---                        [id number], Colonoscopy, flexible; with  biopsy, single or                         multiple Diagnosis Code(s):     --- Professional ---                        Z86.010, Personal history of colonic polyps                        D12.5, Benign neoplasm of sigmoid colon CPT copyright 2022 American Medical Association. All rights reserved. The codes documented in this report are preliminary and upon coder review may  be revised to meet current compliance  requirements. Midge Minium MD, MD 03/28/2023 9:44:18 AM This report has been signed electronically. Number of Addenda: 0 Note Initiated On: 03/28/2023 9:20 AM Scope Withdrawal Time: 0 hours 10 minutes 39 seconds  Total Procedure Duration: 0 hours 16 minutes 4 seconds  Estimated Blood Loss:  Estimated blood loss: none.      [facility]

## 2023-03-28 NOTE — H&P (Signed)
Joshua Minium, MD [facility] [address], [address] [place], [place] [address] [phone] Fax : [phone]  Primary Care Physician:  Donita Brooks, MD Primary Gastroenterologist:  Dr. Servando Snare  Pre-Procedure History & Physical: HPI:  Joshua Trevino. is a 72 y.o. male is here for an colonoscopy.   Past Medical History:  Diagnosis Date   Macular degeneration     Past Surgical History:  Procedure Laterality Date   APPENDECTOMY  1958   ROTATOR CUFF REPAIR  07/07/2010   reattach Bicep also    Prior to Admission medications   Medication Sig Start Date End Date Taking? Authorizing Provider  allopurinol (ZYLOPRIM) 300 MG tablet TAKE 1 TABLET EVERY DAY 11/27/22   Donita Brooks, MD  diphenhydrAMINE (BENADRYL) 25 MG tablet Take 25 mg by mouth every 6 (six) hours as needed for allergies.    [provider]  gemfibrozil (LOPID) 600 MG tablet TAKE 1 TABLET EVERY DAY 09/18/22   Donita Brooks, MD  Multiple Vitamin (MULTIVITAMIN) tablet Take 1 tablet by mouth daily with supper.    [provider]  Multiple Vitamins-Minerals (ICAPS) CAPS Take 2 capsules by mouth 2 (two) times daily.    [provider]  OVER THE COUNTER MEDICATION Collengula    [provider]  OVER THE COUNTER MEDICATION Blueberry    [provider]  prednisoLONE acetate (PRED FORTE) 1 % ophthalmic suspension Place 1 drop into the right eye 4 (four) times daily. 02/06/23   [provider]  Turmeric 400 MG CAPS Take by mouth.    [provider]  VASCEPA 1 g capsule TAKE 2 CAPSULES TWICE DAILY 01/14/23   Donita Brooks, MD    Allergies as of 03/04/2023   (No Known Allergies)    Family History  Problem Relation Age of Onset   Hypertension Mother    Macular degeneration Mother    Cancer Father    Macular degeneration Sister    Early death Paternal Grandfather    Tuberculosis Paternal Grandfather     Social History   Socioeconomic  History   Marital status: Married    Spouse name: Not on file   Number of children: Not on file   Years of education: Not on file   Highest education level: Not on file  Occupational History   Not on file  Tobacco Use   Smoking status: Never   Smokeless tobacco: Never  Substance and Sexual Activity   Alcohol use: No   Drug use: No   Sexual activity: Not Currently  Other Topics Concern   Not on file  Social History Narrative   Not on file   Social Drivers of Health   Financial Resource Strain: Not on file  Food Insecurity: Not on file  Transportation Needs: Not on file  Physical Activity: Not on file  Stress: Not on file  Social Connections: Not on file  Intimate Partner Violence: Not on file    Review of Systems: See HPI, otherwise negative ROS  Physical Exam: Ht 5\' 8"  (1.727 m)   Wt 93 kg   BMI 31.17 kg/m  General:   Alert,  pleasant and cooperative in NAD Head:  Normocephalic and atraumatic. Neck:  Supple; no masses or thyromegaly. Lungs:  Clear throughout to auscultation.    Heart:  Regular rate and rhythm. Abdomen:  Soft, nontender and nondistended. Normal bowel sounds, without guarding, and without rebound.   Neurologic:  Alert and  oriented x4;  grossly normal neurologically.  Impression/Plan: Joshua Trevino. is here for an colonoscopy to be performed for a history of adenomatous polyps on 2019  Risks, benefits, limitations, and alternatives regarding  colonoscopy have been reviewed with the patient.  Questions have been answered.  All parties agreeable.   Joshua Minium, MD  03/28/2023, 9:07 AM

## 2023-03-29 ENCOUNTER — Encounter: Payer: Self-pay | Admitting: Gastroenterology

## 2023-03-29 DIAGNOSIS — M9904 Segmental and somatic dysfunction of sacral region: Secondary | ICD-10-CM | POA: Diagnosis not present

## 2023-03-29 DIAGNOSIS — M9902 Segmental and somatic dysfunction of thoracic region: Secondary | ICD-10-CM | POA: Diagnosis not present

## 2023-03-29 DIAGNOSIS — M9901 Segmental and somatic dysfunction of cervical region: Secondary | ICD-10-CM | POA: Diagnosis not present

## 2023-03-29 DIAGNOSIS — M9903 Segmental and somatic dysfunction of lumbar region: Secondary | ICD-10-CM | POA: Diagnosis not present

## 2023-03-29 LAB — SURGICAL PATHOLOGY

## 2023-03-30 ENCOUNTER — Encounter: Payer: Self-pay | Admitting: Gastroenterology

## 2023-04-11 DIAGNOSIS — M9901 Segmental and somatic dysfunction of cervical region: Secondary | ICD-10-CM | POA: Diagnosis not present

## 2023-04-11 DIAGNOSIS — M9903 Segmental and somatic dysfunction of lumbar region: Secondary | ICD-10-CM | POA: Diagnosis not present

## 2023-04-11 DIAGNOSIS — M9902 Segmental and somatic dysfunction of thoracic region: Secondary | ICD-10-CM | POA: Diagnosis not present

## 2023-04-11 DIAGNOSIS — M9904 Segmental and somatic dysfunction of sacral region: Secondary | ICD-10-CM | POA: Diagnosis not present

## 2023-04-23 DIAGNOSIS — H353211 Exudative age-related macular degeneration, right eye, with active choroidal neovascularization: Secondary | ICD-10-CM | POA: Diagnosis not present

## 2023-04-25 DIAGNOSIS — M9902 Segmental and somatic dysfunction of thoracic region: Secondary | ICD-10-CM | POA: Diagnosis not present

## 2023-04-25 DIAGNOSIS — M9901 Segmental and somatic dysfunction of cervical region: Secondary | ICD-10-CM | POA: Diagnosis not present

## 2023-04-25 DIAGNOSIS — M9903 Segmental and somatic dysfunction of lumbar region: Secondary | ICD-10-CM | POA: Diagnosis not present

## 2023-04-25 DIAGNOSIS — M9904 Segmental and somatic dysfunction of sacral region: Secondary | ICD-10-CM | POA: Diagnosis not present

## 2023-05-09 DIAGNOSIS — M9904 Segmental and somatic dysfunction of sacral region: Secondary | ICD-10-CM | POA: Diagnosis not present

## 2023-05-09 DIAGNOSIS — M9903 Segmental and somatic dysfunction of lumbar region: Secondary | ICD-10-CM | POA: Diagnosis not present

## 2023-05-09 DIAGNOSIS — M9902 Segmental and somatic dysfunction of thoracic region: Secondary | ICD-10-CM | POA: Diagnosis not present

## 2023-05-09 DIAGNOSIS — M9901 Segmental and somatic dysfunction of cervical region: Secondary | ICD-10-CM | POA: Diagnosis not present

## 2023-06-06 DIAGNOSIS — M9903 Segmental and somatic dysfunction of lumbar region: Secondary | ICD-10-CM | POA: Diagnosis not present

## 2023-06-06 DIAGNOSIS — M9904 Segmental and somatic dysfunction of sacral region: Secondary | ICD-10-CM | POA: Diagnosis not present

## 2023-06-06 DIAGNOSIS — M9902 Segmental and somatic dysfunction of thoracic region: Secondary | ICD-10-CM | POA: Diagnosis not present

## 2023-06-06 DIAGNOSIS — M9901 Segmental and somatic dysfunction of cervical region: Secondary | ICD-10-CM | POA: Diagnosis not present

## 2023-06-11 DIAGNOSIS — H353233 Exudative age-related macular degeneration, bilateral, with inactive scar: Secondary | ICD-10-CM | POA: Diagnosis not present

## 2023-06-11 DIAGNOSIS — H353211 Exudative age-related macular degeneration, right eye, with active choroidal neovascularization: Secondary | ICD-10-CM | POA: Diagnosis not present

## 2023-06-27 DIAGNOSIS — H2513 Age-related nuclear cataract, bilateral: Secondary | ICD-10-CM | POA: Diagnosis not present

## 2023-07-04 ENCOUNTER — Other Ambulatory Visit: Payer: Self-pay | Admitting: Family Medicine

## 2023-07-04 DIAGNOSIS — M9903 Segmental and somatic dysfunction of lumbar region: Secondary | ICD-10-CM | POA: Diagnosis not present

## 2023-07-04 DIAGNOSIS — M9901 Segmental and somatic dysfunction of cervical region: Secondary | ICD-10-CM | POA: Diagnosis not present

## 2023-07-04 DIAGNOSIS — M9904 Segmental and somatic dysfunction of sacral region: Secondary | ICD-10-CM | POA: Diagnosis not present

## 2023-07-04 DIAGNOSIS — M9902 Segmental and somatic dysfunction of thoracic region: Secondary | ICD-10-CM | POA: Diagnosis not present

## 2023-07-05 NOTE — Telephone Encounter (Signed)
 Requested Prescriptions  Pending Prescriptions Disp Refills   allopurinol (ZYLOPRIM) 300 MG tablet [Pharmacy Med Name: Allopurinol Oral Tablet 300 MG] 90 tablet 3    Sig: TAKE 1 TABLET EVERY DAY     Endocrinology:  Gout Agents - allopurinol Failed - 07/05/2023 11:54 AM      Failed - Uric Acid in normal range and within 360 days    Uric Acid, Serum  Date Value Ref Range Status  04/12/2017 5.2 4.0 - 8.0 mg/dL Final    Comment:    Therapeutic target for gout patients: <6.0 mg/dL .          Failed - Valid encounter within last 12 months    Recent Outpatient Visits           10 months ago Routine general medical examination at a health care facility   [facility] Donita Brooks, MD   1 year ago Acute otitis media, unspecified otitis media type   [facility], FNP              Passed - Cr in normal range and within 360 days    Creat  Date Value Ref Range Status  08/10/2022 0.86 0.70 - 1.28 mg/dL Final         Passed - CBC within normal limits and completed in the last 12 months    WBC  Date Value Ref Range Status  08/10/2022 8.7 3.8 - 10.8 Thousand/uL Final   RBC  Date Value Ref Range Status  08/10/2022 5.14 4.20 - 5.80 Million/uL Final   Hemoglobin  Date Value Ref Range Status  08/10/2022 15.2 13.2 - 17.1 g/dL Final   HCT  Date Value Ref Range Status  08/10/2022 45.9 38.5 - 50.0 % Final   MCHC  Date Value Ref Range Status  08/10/2022 33.1 32.0 - 36.0 g/dL Final   [facility]  Date Value Ref Range Status  08/10/2022 29.6 27.0 - 33.0 pg Final   MCV  Date Value Ref Range Status  08/10/2022 89.3 80.0 - 100.0 fL Final   No results found for: "PLTCOUNTKUC", "LABPLAT", "POCPLA" RDW  Date Value Ref Range Status  08/10/2022 13.1 11.0 - 15.0 % Final          gemfibrozil (LOPID) 600 MG tablet [Pharmacy Med Name: Gemfibrozil Oral Tablet 600 MG] 90 tablet 3    Sig: TAKE 1 TABLET EVERY DAY      Cardiovascular:  Antilipid - Fibric Acid Derivatives Failed - 07/05/2023 11:54 AM      Failed - Valid encounter within last 12 months    Recent Outpatient Visits           10 months ago Routine general medical examination at a health care facility   [facility] Donita Brooks, MD   1 year ago Acute otitis media, unspecified otitis media type   [facility] [place] [facility], FNP              Failed - Lipid Panel in normal range within the last 12 months    Cholesterol  Date Value Ref Range Status  08/10/2022 128 <200 mg/dL Final   LDL Cholesterol (Calc)  Date Value Ref Range Status  08/10/2022 62 mg/dL (calc) Final    Comment:    Reference range: <100 . Desirable range <100 mg/dL for primary prevention;   <70 mg/dL for patients with CHD or  diabetic patients  with > or = 2 CHD risk factors. Marland Kitchen LDL-C is now calculated using the Martin-Hopkins  calculation, which is a validated novel method providing  better accuracy than the Friedewald equation in the  estimation of LDL-C.  Horald Pollen et al. Lenox Ahr. 1610;960(45): 2061-2068  (http://education.[facility].com/faq/FAQ164)    HDL  Date Value Ref Range Status  08/10/2022 37 (L) > OR = 40 mg/dL Final   Triglycerides  Date Value Ref Range Status  08/10/2022 218 (H) <150 mg/dL Final    Comment:    . If a non-fasting specimen was collected, consider repeat triglyceride testing on a fasting specimen if clinically indicated.  Perry Mount et al. J. of Clin. Lipidol. 2015;9:129-169. Marland Kitchen          Passed - ALT in normal range and within 360 days    ALT  Date Value Ref Range Status  08/10/2022 25 9 - 46 U/L Final         Passed - AST in normal range and within 360 days    AST  Date Value Ref Range Status  08/10/2022 30 10 - 35 U/L Final         Passed - Cr in normal range and within 360 days    Creat  Date Value Ref Range Status  08/10/2022 0.86 0.70 - 1.28  mg/dL Final         Passed - HGB in normal range and within 360 days    Hemoglobin  Date Value Ref Range Status  08/10/2022 15.2 13.2 - 17.1 g/dL Final         Passed - HCT in normal range and within 360 days    HCT  Date Value Ref Range Status  08/10/2022 45.9 38.5 - 50.0 % Final         Passed - PLT in normal range and within 360 days    Platelets  Date Value Ref Range Status  08/10/2022 292 140 - 400 Thousand/uL Final         Passed - WBC in normal range and within 360 days    WBC  Date Value Ref Range Status  08/10/2022 8.7 3.8 - 10.8 Thousand/uL Final         Passed - eGFR is 30 or above and within 360 days    GFR, Est African American  Date Value Ref Range Status  08/05/2020 94 > OR = 60 mL/min/1.77m2 Final   GFR, Est Non African American  Date Value Ref Range Status  08/05/2020 81 > OR = 60 mL/min/1.44m2 Final   eGFR  Date Value Ref Range Status  08/10/2022 93 > OR = 60 mL/min/1.57m2 Final

## 2023-07-12 NOTE — Telephone Encounter (Signed)
 The patient called to check on 2 scripts as they were denied but after speaking with him he didn't realize he already had refills on them. He will contact [facility]

## 2023-08-01 DIAGNOSIS — M9903 Segmental and somatic dysfunction of lumbar region: Secondary | ICD-10-CM | POA: Diagnosis not present

## 2023-08-01 DIAGNOSIS — M9901 Segmental and somatic dysfunction of cervical region: Secondary | ICD-10-CM | POA: Diagnosis not present

## 2023-08-01 DIAGNOSIS — M9904 Segmental and somatic dysfunction of sacral region: Secondary | ICD-10-CM | POA: Diagnosis not present

## 2023-08-01 DIAGNOSIS — M9902 Segmental and somatic dysfunction of thoracic region: Secondary | ICD-10-CM | POA: Diagnosis not present

## 2023-08-06 ENCOUNTER — Other Ambulatory Visit

## 2023-08-06 DIAGNOSIS — H353211 Exudative age-related macular degeneration, right eye, with active choroidal neovascularization: Secondary | ICD-10-CM | POA: Diagnosis not present

## 2023-08-13 ENCOUNTER — Telehealth: Payer: Self-pay

## 2023-08-13 NOTE — Telephone Encounter (Signed)
 Copied from CRM [id number]. Topic: Clinical - Lab/Test Results >> Aug 13, 2023 10:30 AM Tiffany S wrote: Reason for CRM: Patient is asking if can do labs before his physical no orders are in the chart please follow up with patient

## 2023-09-03 ENCOUNTER — Ambulatory Visit: Payer: Self-pay | Admitting: Family Medicine

## 2023-09-03 ENCOUNTER — Other Ambulatory Visit

## 2023-09-03 DIAGNOSIS — Z0000 Encounter for general adult medical examination without abnormal findings: Secondary | ICD-10-CM

## 2023-09-03 DIAGNOSIS — Z125 Encounter for screening for malignant neoplasm of prostate: Secondary | ICD-10-CM

## 2023-09-03 DIAGNOSIS — E8881 Metabolic syndrome: Secondary | ICD-10-CM | POA: Diagnosis not present

## 2023-09-03 DIAGNOSIS — E781 Pure hyperglyceridemia: Secondary | ICD-10-CM

## 2023-09-04 LAB — CBC WITH DIFFERENTIAL/PLATELET
Absolute Lymphocytes: 2074 {cells}/uL (ref 850–3900)
Absolute Monocytes: 729 {cells}/uL (ref 200–950)
Basophils Absolute: 57 {cells}/uL (ref 0–200)
Basophils Relative: 0.7 %
Eosinophils Absolute: 267 {cells}/uL (ref 15–500)
Eosinophils Relative: 3.3 %
HCT: 45.4 % (ref 38.5–50.0)
Hemoglobin: 15 g/dL (ref 13.2–17.1)
MCH: 29.7 pg (ref 27.0–33.0)
MCHC: 33 g/dL (ref 32.0–36.0)
MCV: 89.9 fL (ref 80.0–100.0)
MPV: 9.9 fL (ref 7.5–12.5)
Monocytes Relative: 9 %
Neutro Abs: 4973 {cells}/uL (ref 1500–7800)
Neutrophils Relative %: 61.4 %
Platelets: 297 10*3/uL (ref 140–400)
RBC: 5.05 10*6/uL (ref 4.20–5.80)
RDW: 13.1 % (ref 11.0–15.0)
Total Lymphocyte: 25.6 %
WBC: 8.1 10*3/uL (ref 3.8–10.8)

## 2023-09-04 LAB — COMPLETE METABOLIC PANEL WITHOUT GFR
AG Ratio: 1.9 (calc) (ref 1.0–2.5)
ALT: 22 U/L (ref 9–46)
AST: 25 U/L (ref 10–35)
Albumin: 4.7 g/dL (ref 3.6–5.1)
Alkaline phosphatase (APISO): 93 U/L (ref 35–144)
BUN: 14 mg/dL (ref 7–25)
CO2: 29 mmol/L (ref 20–32)
Calcium: 9.7 mg/dL (ref 8.6–10.3)
Chloride: 102 mmol/L (ref 98–110)
Creat: 0.85 mg/dL (ref 0.70–1.28)
Globulin: 2.5 g/dL (ref 1.9–3.7)
Glucose, Bld: 112 mg/dL — ABNORMAL HIGH (ref 65–99)
Potassium: 3.9 mmol/L (ref 3.5–5.3)
Sodium: 142 mmol/L (ref 135–146)
Total Bilirubin: 0.5 mg/dL (ref 0.2–1.2)
Total Protein: 7.2 g/dL (ref 6.1–8.1)

## 2023-09-04 LAB — LIPID PANEL
Cholesterol: 114 mg/dL (ref <200)
HDL: 35 mg/dL — ABNORMAL LOW (ref >=40)
LDL Cholesterol (Calc): 46 mg/dL
Non-HDL Cholesterol (Calc): 79 mg/dL (ref <130)
Total CHOL/HDL Ratio: 3.3 (calc) (ref <5.0)
Triglycerides: 318 mg/dL — ABNORMAL HIGH (ref <150)

## 2023-09-04 LAB — PSA: PSA: 1.91 ng/mL (ref <=4.00)

## 2023-09-05 DIAGNOSIS — M9904 Segmental and somatic dysfunction of sacral region: Secondary | ICD-10-CM | POA: Diagnosis not present

## 2023-09-05 DIAGNOSIS — M9901 Segmental and somatic dysfunction of cervical region: Secondary | ICD-10-CM | POA: Diagnosis not present

## 2023-09-05 DIAGNOSIS — M9903 Segmental and somatic dysfunction of lumbar region: Secondary | ICD-10-CM | POA: Diagnosis not present

## 2023-09-05 DIAGNOSIS — M9902 Segmental and somatic dysfunction of thoracic region: Secondary | ICD-10-CM | POA: Diagnosis not present

## 2023-09-12 ENCOUNTER — Ambulatory Visit: Payer: Worker's Compensation | Admitting: *Deleted

## 2023-09-12 VITALS — Temp 98.7°F | Ht 68.0 in | Wt 211.0 lb

## 2023-09-12 DIAGNOSIS — Z0000 Encounter for general adult medical examination without abnormal findings: Secondary | ICD-10-CM

## 2023-09-12 NOTE — Progress Notes (Addendum)
 Subjective:   Joshua Trevino. is a 73 y.o. male who presents for Medicare Annual/Subsequent preventive examination.  Visit Complete: Virtual I connected with  Joshua Trevino. on 09/12/23 by a in person and verified that I am speaking with the correct person using two identifiers.  Patient location in office  Provider location in office  I discussed the limitations of evaluation and management by telemedicine. The patient expressed understanding and agreed to proceed.     Cardiac Risk Factors include: advanced age (>41men, >38 women);male gender;hypertension     Objective:     There were no vitals filed for this visit. There is no height or weight on file to calculate BMI.     03/28/2023    9:10 AM 08/12/2020    8:23 AM  Advanced Directives  Does Patient Have a Medical Advance Directive? Yes No  Would patient like information on creating a medical advance directive?  No - Patient declined    Current Medications (verified) Outpatient Encounter Medications as of 09/12/2023  Medication Sig   allopurinol  (ZYLOPRIM ) 300 MG tablet TAKE 1 TABLET EVERY DAY   diphenhydrAMINE (BENADRYL) 25 MG tablet Take 25 mg by mouth every 6 (six) hours as needed for allergies.   gemfibrozil  (LOPID ) 600 MG tablet TAKE 1 TABLET EVERY DAY   Multiple Vitamin (MULTIVITAMIN) tablet Take 1 tablet by mouth daily with supper.   Multiple Vitamins-Minerals (ICAPS) CAPS Take 2 capsules by mouth 2 (two) times daily.   OVER THE COUNTER MEDICATION Collengula   OVER THE COUNTER MEDICATION Blueberry   prednisoLONE acetate (PRED FORTE) 1 % ophthalmic suspension Place 1 drop into the right eye 4 (four) times daily.   Turmeric 400 MG CAPS Take by mouth.   VASCEPA  1 g capsule TAKE 2 CAPSULES TWICE DAILY   No facility-administered encounter medications on file as of 09/12/2023.    Allergies (verified) Patient has no known allergies.   History: Past Medical History:  Diagnosis Date   Gout     Hypertriglyceridemia    Macular degeneration    Past Surgical History:  Procedure Laterality Date   APPENDECTOMY  04/09/1956   COLONOSCOPY WITH PROPOFOL      x2   COLONOSCOPY WITH PROPOFOL  N/A 03/28/2023   Procedure: COLONOSCOPY WITH PROPOFOL ;  Surgeon: Marnee Sink, MD;  Location: [facility] ENDOSCOPY;  Service: Endoscopy;  Laterality: N/A;   POLYPECTOMY  03/28/2023   Procedure: POLYPECTOMY;  Surgeon: Marnee Sink, MD;  Location: [facility] ENDOSCOPY;  Service: Endoscopy;;   ROTATOR CUFF REPAIR  07/07/2010   reattach Bicep also   Family History  Problem Relation Age of Onset   Hypertension Mother    Macular degeneration Mother    Cancer Father    Macular degeneration Sister    Early death Paternal Grandfather    Tuberculosis Paternal Grandfather    Social History   Socioeconomic History   Marital status: Married    Spouse name: Not on file   Number of children: Not on file   Years of education: Not on file   Highest education level: Not on file  Occupational History   Not on file  Tobacco Use   Smoking status: Never   Smokeless tobacco: Never  Substance and Sexual Activity   Alcohol use: No   Drug use: No   Sexual activity: Not Currently  Other Topics Concern   Not on file  Social History Narrative   Not on file   Social Drivers of Health   Financial Resource Strain:  Low Risk  (09/12/2023)   Overall Financial Resource Strain (CARDIA)    Difficulty of Paying Living Expenses: Not hard at all  Food Insecurity: No Food Insecurity (09/12/2023)   Hunger Vital Sign    Worried About Running Out of Food in the Last Year: Never true    Ran Out of Food in the Last Year: Never true  Transportation Needs: No Transportation Needs (09/12/2023)   PRAPARE - Administrator, Civil Service (Medical): No    Lack of Transportation (Non-Medical): No  Physical Activity: Inactive (09/12/2023)   Exercise Vital Sign    Days of Exercise per Week: 0 days    Minutes of Exercise per Session: 0  min  Stress: No Stress Concern Present (09/12/2023)   [employer] of Occupational Health - Occupational Stress Questionnaire    Feeling of Stress : Not at all  Social Connections: Moderately Isolated (09/12/2023)   Social Connection and Isolation Panel [NHANES]    Frequency of Communication with Friends and Family: More than three times a week    Frequency of Social Gatherings with Friends and Family: Three times a week    Attends Religious Services: Never    Active Member of Clubs or Organizations: No    Attends Engineer, structural: Never    Marital Status: Married    Tobacco Counseling Counseling given: Not Answered   Clinical Intake:  Pre-visit preparation completed: Yes  Pain : No/denies pain     Diabetes: No  How often do you need to have someone help you when you read instructions, pamphlets, or other written materials from your doctor or pharmacy?: 1 - Never  Interpreter Needed?: No  Information entered by :: Kieth Pelt LPN   Activities of Daily Living    09/12/2023    9:52 AM  In your present state of health, do you have any difficulty performing the following activities:  Hearing? 0  Vision? 0  Difficulty concentrating or making decisions? 0  Walking or climbing stairs? 0  Dressing or bathing? 0  Doing errands, shopping? 0  Preparing Food and eating ? N  Using the Toilet? N  In the past six months, have you accidently leaked urine? N  Do you have problems with loss of bowel control? N  Managing your Medications? N  Managing your Finances? N  Housekeeping or managing your Housekeeping? N    Patient Care Team: Austine Lefort, MD as PCP - General (Family Medicine)  Indicate any recent Medical Services you may have received from other than [facility] providers in the past year (date may be approximate).     Assessment:    This is a routine wellness examination for Joshua Trevino.  Hearing/Vision screen Hearing Screening - Comments:: No trouble  hearing  Vision Screening - Comments:: Up to date [name]   Goals Addressed             This Visit's Progress    Patient Stated       Stay healthy       Depression Screen    09/12/2023   10:05 AM 03/13/2022    8:12 AM 08/12/2020    8:21 AM 08/10/2019    9:15 AM 07/21/2018    9:28 AM 07/15/2017    8:58 AM 07/15/2017    8:22 AM  PHQ 2/9 Scores  PHQ - 2 Score 0 0 0 0 0 0 0  PHQ- 9 Score 0 0         Fall  Risk    09/12/2023    9:48 AM 03/13/2022    8:12 AM 08/14/2021    8:17 AM 08/12/2020    8:21 AM 08/10/2019    9:15 AM  Fall Risk   Falls in the past year? 0 0 0 0 0  Number falls in past yr: 0  0    Injury with Fall? 0  0    Risk for fall due to :    No Fall Risks   Follow up Falls evaluation completed;Education provided;Falls prevention discussed   Falls evaluation completed Falls evaluation completed    MEDICARE RISK AT HOME: Medicare Risk at Home Any stairs in or around the home?: Yes If so, are there any without handrails?: No Home free of loose throw rugs in walkways, pet beds, electrical cords, etc?: Yes Adequate lighting in your home to reduce risk of falls?: Yes Life alert?: No Use of a cane, walker or w/c?: No Grab bars in the bathroom?: Yes Shower chair or bench in shower?: No Elevated toilet seat or a handicapped toilet?: No  TIMED UP AND GO:  Was the test performed?  No    Cognitive Function:        09/12/2023    9:51 AM  6CIT Screen  What Year? 0 points  What month? 0 points  What time? 0 points  Count back from 20 0 points  Months in reverse 0 points  Repeat phrase 2 points  Total Score 2 points    Immunizations Immunization History  Administered Date(s) Administered   Influenza, High Dose Seasonal PF 01/28/2017   Influenza,inj,Quad PF,6+ Mos 01/13/2018, 01/26/2019   Influenza-Unspecified 03/13/2016   Moderna Sars-Covid-2 Vaccination 11/02/2019, 12/02/2019   Pneumococcal Conjugate-13 04/12/2017, 01/26/2019   Pneumococcal  Polysaccharide-23 07/09/2016   Tdap 07/08/2015   Zoster Recombinant(Shingrix) 07/16/2017, 10/01/2017    TDAP status: Up to date  Flu Vaccine status: Up to date  Pneumococcal vaccine status: Up to date  Covid-19 vaccine status: Declined, Education has been provided regarding the importance of this vaccine but patient still declined. Advised may receive this vaccine at local pharmacy or Health Dept.or vaccine clinic. Aware to provide a copy of the vaccination record if obtained from local pharmacy or Health Dept. Verbalized acceptance and understanding.  Qualifies for Shingles Vaccine? No   Zostavax completed Yes   Shingrix Completed?: Yes  Screening Tests Health Maintenance  Topic Date Due   COVID-19 Vaccine (3 - 2024-25 season) 09/28/2023 (Originally 12/09/2022)   INFLUENZA VACCINE  11/08/2023   Medicare Annual Wellness (AWV)  09/11/2024   DTaP/Tdap/Td (2 - Td or Tdap) 07/07/2025   Colonoscopy  03/27/2033   Pneumonia Vaccine 59+ Years old  Completed   Hepatitis C Screening  Completed   Zoster Vaccines- Shingrix  Completed   HPV VACCINES  Aged Out   Meningococcal B Vaccine  Aged Out    Health Maintenance  There are no preventive care reminders to display for this patient.   Colorectal cancer screening: No longer required.   Lung Cancer Screening: (Low Dose CT Chest recommended if Age 74-80 years, 20 pack-year currently smoking OR have quit w/in 15years.) does not qualify.   Lung Cancer Screening Referral:   Additional Screening:  Hepatitis C Screening: does not qualify; Completed 2017  Vision Screening: Recommended annual ophthalmology exams for early detection of glaucoma and other disorders of the eye. Is the patient up to date with their annual eye exam?  Yes  Who is the provider or what is the  name of the office in which the patient attends annual eye exams? [facility] If pt is not established with a provider, would they like to be referred to a provider to  establish care? No .   Dental Screening: Recommended annual dental exams for proper oral hygiene    Community Resource Referral / Chronic Care Management: CRR required this visit?  No   CCM required this visit?  No     Plan:     I have personally reviewed and noted the following in the patient's chart:   Medical and social history Use of alcohol, tobacco or illicit drugs  Current medications and supplements including opioid prescriptions. Patient is not currently taking opioid prescriptions. Functional ability and status Nutritional status Physical activity Advanced directives List of other physicians Hospitalizations, surgeries, and ER visits in previous 12 months Vitals Screenings to include cognitive, depression, and falls Referrals and appointments  In addition, I have reviewed and discussed with patient certain preventive protocols, quality metrics, and best practice recommendations. A written personalized care plan for preventive services as well as general preventive health recommendations were provided to patient.     Kieth Pelt, LPN   0/04/270   After Visit Summary:  Nurse Notes:

## 2023-09-12 NOTE — Patient Instructions (Signed)
 Mr. Joshua Trevino , Thank you for taking time to come for your Medicare Wellness Visit. I appreciate your ongoing commitment to your health goals. Please review the following plan we discussed and let me know if I can assist you in the future.   Screening recommendations/referrals: Colonoscopy: up to date Recommended yearly ophthalmology/optometry visit for glaucoma screening and checkup Recommended yearly dental visit for hygiene and checkup  Vaccinations: Influenza vaccine: up to date Pneumococcal vaccine: up to date Tdap vaccine: up to date Shingles vaccine: up to date       Preventive Care 65 Years and Older, Male Preventive care refers to lifestyle choices and visits with your health care provider that can promote health and wellness. What does preventive care include? A yearly physical exam. This is also called an annual well check. Dental exams once or twice a year. Routine eye exams. Ask your health care provider how often you should have your eyes checked. Personal lifestyle choices, including: Daily care of your teeth and gums. Regular physical activity. Eating a healthy diet. Avoiding tobacco and drug use. Limiting alcohol use. Practicing safe sex. Taking low doses of aspirin every day. Taking vitamin and mineral supplements as recommended by your health care provider. What happens during an annual well check? The services and screenings done by your health care provider during your annual well check will depend on your age, overall health, lifestyle risk factors, and family history of disease. Counseling  Your health care provider may ask you questions about your: Alcohol use. Tobacco use. Drug use. Emotional well-being. Home and relationship well-being. Sexual activity. Eating habits. History of falls. Memory and ability to understand (cognition). Work and work Astronomer. Screening  You may have the following tests or measurements: Height, weight, and  BMI. Blood pressure. Lipid and cholesterol levels. These may be checked every 5 years, or more frequently if you are over 60 years old. Skin check. Lung cancer screening. You may have this screening every year starting at age 79 if you have a 30-pack-year history of smoking and currently smoke or have quit within the past 15 years. Fecal occult blood test (FOBT) of the stool. You may have this test every year starting at age 32. Flexible sigmoidoscopy or colonoscopy. You may have a sigmoidoscopy every 5 years or a colonoscopy every 10 years starting at age 46. Prostate cancer screening. Recommendations will vary depending on your family history and other risks. Hepatitis C blood test. Hepatitis B blood test. Sexually transmitted disease (STD) testing. Diabetes screening. This is done by checking your blood sugar (glucose) after you have not eaten for a while (fasting). You may have this done every 1-3 years. Abdominal aortic aneurysm (AAA) screening. You may need this if you are a current or former smoker. Osteoporosis. You may be screened starting at age 66 if you are at high risk. Talk with your health care provider about your test results, treatment options, and if necessary, the need for more tests. Vaccines  Your health care provider may recommend certain vaccines, such as: Influenza vaccine. This is recommended every year. Tetanus, diphtheria, and acellular pertussis (Tdap, Td) vaccine. You may need a Td booster every 10 years. Zoster vaccine. You may need this after age 20. Pneumococcal 13-valent conjugate (PCV13) vaccine. One dose is recommended after age 31. Pneumococcal polysaccharide (PPSV23) vaccine. One dose is recommended after age 37. Talk to your health care provider about which screenings and vaccines you need and how often you need them. This information is not  intended to replace advice given to you by your health care provider. Make sure you discuss any questions you have  with your health care provider. Document Released: 04/22/2015 Document Revised: 12/14/2015 Document Reviewed: 01/25/2015 Elsevier Interactive Patient Education  2017 [employer].  Fall Prevention in the Home Falls can cause injuries. They can happen to people of all ages. There are many things you can do to make your home safe and to help prevent falls. What can I do on the outside of my home? Regularly fix the edges of walkways and driveways and fix any cracks. Remove anything that might make you trip as you walk through a door, such as a raised step or threshold. Trim any bushes or trees on the path to your home. Use bright outdoor lighting. Clear any walking paths of anything that might make someone trip, such as rocks or tools. Regularly check to see if handrails are loose or broken. Make sure that both sides of any steps have handrails. Any raised decks and porches should have guardrails on the edges. Have any leaves, snow, or ice cleared regularly. Use sand or salt on walking paths during winter. Clean up any spills in your garage right away. This includes oil or grease spills. What can I do in the bathroom? Use night lights. Install grab bars by the toilet and in the tub and shower. Do not use towel bars as grab bars. Use non-skid mats or decals in the tub or shower. If you need to sit down in the shower, use a plastic, non-slip stool. Keep the floor dry. Clean up any water that spills on the floor as soon as it happens. Remove soap buildup in the tub or shower regularly. Attach bath mats securely with double-sided non-slip rug tape. Do not have throw rugs and other things on the floor that can make you trip. What can I do in the bedroom? Use night lights. Make sure that you have a light by your bed that is easy to reach. Do not use any sheets or blankets that are too big for your bed. They should not hang down onto the floor. Have a firm chair that has side arms. You can use  this for support while you get dressed. Do not have throw rugs and other things on the floor that can make you trip. What can I do in the kitchen? Clean up any spills right away. Avoid walking on wet floors. Keep items that you use a lot in easy-to-reach places. If you need to reach something above you, use a strong step stool that has a grab bar. Keep electrical cords out of the way. Do not use floor polish or wax that makes floors slippery. If you must use wax, use non-skid floor wax. Do not have throw rugs and other things on the floor that can make you trip. What can I do with my stairs? Do not leave any items on the stairs. Make sure that there are handrails on both sides of the stairs and use them. Fix handrails that are broken or loose. Make sure that handrails are as long as the stairways. Check any carpeting to make sure that it is firmly attached to the stairs. Fix any carpet that is loose or worn. Avoid having throw rugs at the top or bottom of the stairs. If you do have throw rugs, attach them to the floor with carpet tape. Make sure that you have a light switch at the top of the stairs  and the bottom of the stairs. If you do not have them, ask someone to add them for you. What else can I do to help prevent falls? Wear shoes that: Do not have high heels. Have rubber bottoms. Are comfortable and fit you well. Are closed at the toe. Do not wear sandals. If you use a stepladder: Make sure that it is fully opened. Do not climb a closed stepladder. Make sure that both sides of the stepladder are locked into place. Ask someone to hold it for you, if possible. Clearly mark and make sure that you can see: Any grab bars or handrails. First and last steps. Where the edge of each step is. Use tools that help you move around (mobility aids) if they are needed. These include: Canes. Walkers. Scooters. Crutches. Turn on the lights when you go into a dark area. Replace any light bulbs  as soon as they burn out. Set up your furniture so you have a clear path. Avoid moving your furniture around. If any of your floors are uneven, fix them. If there are any pets around you, be aware of where they are. Review your medicines with your doctor. Some medicines can make you feel dizzy. This can increase your chance of falling. Ask your doctor what other things that you can do to help prevent falls. This information is not intended to replace advice given to you by your health care provider. Make sure you discuss any questions you have with your health care provider. Document Released: 01/20/2009 Document Revised: 09/01/2015 Document Reviewed: 04/30/2014 Elsevier Interactive Patient Education  2017 [employer].

## 2023-09-27 ENCOUNTER — Encounter: Payer: Self-pay | Admitting: Family Medicine

## 2023-09-27 ENCOUNTER — Ambulatory Visit: Admitting: Family Medicine

## 2023-09-27 VITALS — BP 123/78 | HR 70 | Temp 97.3°F | Ht 68.0 in | Wt 212.0 lb

## 2023-09-27 DIAGNOSIS — E781 Pure hyperglyceridemia: Secondary | ICD-10-CM

## 2023-09-27 DIAGNOSIS — E8881 Metabolic syndrome: Secondary | ICD-10-CM

## 2023-09-27 DIAGNOSIS — Z0000 Encounter for general adult medical examination without abnormal findings: Secondary | ICD-10-CM

## 2023-09-27 DIAGNOSIS — Z0001 Encounter for general adult medical examination with abnormal findings: Secondary | ICD-10-CM | POA: Diagnosis not present

## 2023-09-27 NOTE — Progress Notes (Signed)
 Subjective:    Patient ID: Joshua Trevino., male    DOB: September 25, 1950, 73 y.o.   MRN: [id number]  HPI  Patient is a very pleasant 73 year old Caucasian gentleman here today for complete physical exam.  He has a history of mild prediabetes, hypertriglyceridemia, gout.  He is due for the RSV vaccine.  Shingles shot and pneumonia shot are up-to-date.  He had a colonoscopy in 2024.  This was clear.  Therefore he does not require another colonoscopy.  His most recent PSA was within normal limits.  His blood sugar was mildly elevated and his triglycerides were mildly elevated.  Post recent lab work is listed below Lab on 09/03/2023  Component Date Value Ref Range Status   WBC 09/03/2023 8.1  3.8 - 10.8 Thousand/uL Final   RBC 09/03/2023 5.05  4.20 - 5.80 Million/uL Final   Hemoglobin 09/03/2023 15.0  13.2 - 17.1 g/dL Final   HCT 56/21/3086 45.4  38.5 - 50.0 % Final   MCV 09/03/2023 89.9  80.0 - 100.0 fL Final   MCH 09/03/2023 29.7  27.0 - 33.0 pg Final   MCHC 09/03/2023 33.0  32.0 - 36.0 g/dL Final   Comment: For adults, a slight decrease in the calculated MCHC value (in the range of 30 to 32 g/dL) is most likely not clinically significant; however, it should be interpreted with caution in correlation with other red cell parameters and the patient's clinical condition.    RDW 09/03/2023 13.1  11.0 - 15.0 % Final   Platelets 09/03/2023 297  140 - 400 Thousand/uL Final   MPV 09/03/2023 9.9  7.5 - 12.5 fL Final   Neutro Abs 09/03/2023 4,973  1,500 - 7,800 cells/uL Final   Absolute Lymphocytes 09/03/2023 2,074  850 - 3,900 cells/uL Final   Absolute Monocytes 09/03/2023 729  200 - 950 cells/uL Final   Eosinophils Absolute 09/03/2023 267  15 - 500 cells/uL Final   Basophils Absolute 09/03/2023 57  0 - 200 cells/uL Final   Neutrophils Relative % 09/03/2023 61.4  % Final   Total Lymphocyte 09/03/2023 25.6  % Final   Monocytes Relative 09/03/2023 9.0  % Final   Eosinophils Relative  09/03/2023 3.3  % Final   Basophils Relative 09/03/2023 0.7  % Final   Glucose, Bld 09/03/2023 112 (H)  65 - 99 mg/dL Final   Comment: .            Fasting reference interval . For someone without known diabetes, a glucose value between 100 and 125 mg/dL is consistent with prediabetes and should be confirmed with a follow-up test. .    BUN 09/03/2023 14  7 - 25 mg/dL Final   Creat 57/84/6962 0.85  0.70 - 1.28 mg/dL Final   BUN/Creatinine Ratio 09/03/2023 SEE NOTE:  6 - 22 (calc) Final   Comment:    Not Reported: BUN and Creatinine are within    reference range. .    Sodium 09/03/2023 142  135 - 146 mmol/L Final   Potassium 09/03/2023 3.9  3.5 - 5.3 mmol/L Final   Chloride 09/03/2023 102  98 - 110 mmol/L Final   CO2 09/03/2023 29  20 - 32 mmol/L Final   Calcium 09/03/2023 9.7  8.6 - 10.3 mg/dL Final   Total Protein 95/28/4132 7.2  6.1 - 8.1 g/dL Final   Albumin 44/04/270 4.7  3.6 - 5.1 g/dL Final   Globulin 53/66/4403 2.5  1.9 - 3.7 g/dL (calc) Final   AG Ratio 09/03/2023 1.9  1.0 - 2.5 (calc) Final   Total Bilirubin 09/03/2023 0.5  0.2 - 1.2 mg/dL Final   Alkaline phosphatase (APISO) 09/03/2023 93  35 - 144 U/L Final   AST 09/03/2023 25  10 - 35 U/L Final   ALT 09/03/2023 22  9 - 46 U/L Final   Cholesterol 09/03/2023 114  <200 mg/dL Final   HDL 40/98/1191 35 (L)  > OR = 40 mg/dL Final   Triglycerides 47/82/9562 318 (H)  <150 mg/dL Final   Comment: . If a non-fasting specimen was collected, consider repeat triglyceride testing on a fasting specimen if clinically indicated.  Imagene Mam et al. J. of Clin. Lipidol. 2015;9:129-169. Aaron Aas    LDL Cholesterol (Calc) 09/03/2023 46  mg/dL (calc) Final   Comment: Reference range: <100 . Desirable range <100 mg/dL for primary prevention;   <70 mg/dL for patients with CHD or diabetic patients  with > or = 2 CHD risk factors. Aaron Aas LDL-C is now calculated using the Martin-Hopkins  calculation, which is a validated novel method providing   better accuracy than the Friedewald equation in the  estimation of LDL-C.  Melinda Sprawls et al. Erroll Heard. 1308;657(84): 2061-2068  (http://education.[facility].com/faq/FAQ164)    Total CHOL/HDL Ratio 09/03/2023 3.3  <6.9 (calc) Final   Non-HDL Cholesterol (Calc) 09/03/2023 79  <130 mg/dL (calc) Final   Comment: For patients with diabetes plus 1 major ASCVD risk  factor, treating to a non-HDL-C goal of <100 mg/dL  (LDL-C of <62 mg/dL) is considered a therapeutic  option.    PSA 09/03/2023 1.91  < OR = 4.00 ng/mL Final   Comment: The total PSA value from this assay system is  standardized against the WHO standard. The test  result will be approximately 20% lower when compared  to the equimolar-standardized total PSA (Beckman  Coulter). Comparison of serial PSA results should be  interpreted with this fact in mind. . This test was performed using the Siemens  chemiluminescent method. Values obtained from  different assay methods cannot be used interchangeably. PSA levels, regardless of value, should not be interpreted as absolute evidence of the presence or absence of disease.     Immunization History  Administered Date(s) Administered   Influenza, High Dose Seasonal PF 01/28/2017   Influenza,inj,Quad PF,6+ Mos 01/13/2018, 01/26/2019   Influenza-Unspecified 03/13/2016   Moderna Sars-Covid-2 Vaccination 11/02/2019, 12/02/2019   Pneumococcal Conjugate-13 04/12/2017, 01/26/2019   Pneumococcal Polysaccharide-23 07/09/2016   Tdap 07/08/2015   Zoster Recombinant(Shingrix) 07/16/2017, 10/01/2017     Lab on 09/03/2023  Component Date Value Ref Range Status   WBC 09/03/2023 8.1  3.8 - 10.8 Thousand/uL Final   RBC 09/03/2023 5.05  4.20 - 5.80 Million/uL Final   Hemoglobin 09/03/2023 15.0  13.2 - 17.1 g/dL Final   HCT 95/28/4132 45.4  38.5 - 50.0 % Final   MCV 09/03/2023 89.9  80.0 - 100.0 fL Final   MCH 09/03/2023 29.7  27.0 - 33.0 pg Final   MCHC 09/03/2023 33.0  32.0 - 36.0  g/dL Final   Comment: For adults, a slight decrease in the calculated MCHC value (in the range of 30 to 32 g/dL) is most likely not clinically significant; however, it should be interpreted with caution in correlation with other red cell parameters and the patient's clinical condition.    RDW 09/03/2023 13.1  11.0 - 15.0 % Final   Platelets 09/03/2023 297  140 - 400 Thousand/uL Final   MPV 09/03/2023 9.9  7.5 - 12.5 fL Final   Neutro Abs 09/03/2023 4,973  1,500 - 7,800 cells/uL Final   Absolute Lymphocytes 09/03/2023 2,074  850 - 3,900 cells/uL Final   Absolute Monocytes 09/03/2023 729  200 - 950 cells/uL Final   Eosinophils Absolute 09/03/2023 267  15 - 500 cells/uL Final   Basophils Absolute 09/03/2023 57  0 - 200 cells/uL Final   Neutrophils Relative % 09/03/2023 61.4  % Final   Total Lymphocyte 09/03/2023 25.6  % Final   Monocytes Relative 09/03/2023 9.0  % Final   Eosinophils Relative 09/03/2023 3.3  % Final   Basophils Relative 09/03/2023 0.7  % Final   Glucose, Bld 09/03/2023 112 (H)  65 - 99 mg/dL Final   Comment: .            Fasting reference interval . For someone without known diabetes, a glucose value between 100 and 125 mg/dL is consistent with prediabetes and should be confirmed with a follow-up test. .    BUN 09/03/2023 14  7 - 25 mg/dL Final   Creat 16/01/9603 0.85  0.70 - 1.28 mg/dL Final   BUN/Creatinine Ratio 09/03/2023 SEE NOTE:  6 - 22 (calc) Final   Comment:    Not Reported: BUN and Creatinine are within    reference range. .    Sodium 09/03/2023 142  135 - 146 mmol/L Final   Potassium 09/03/2023 3.9  3.5 - 5.3 mmol/L Final   Chloride 09/03/2023 102  98 - 110 mmol/L Final   CO2 09/03/2023 29  20 - 32 mmol/L Final   Calcium 09/03/2023 9.7  8.6 - 10.3 mg/dL Final   Total Protein 54/12/8117 7.2  6.1 - 8.1 g/dL Final   Albumin 14/78/2956 4.7  3.6 - 5.1 g/dL Final   Globulin 21/30/8657 2.5  1.9 - 3.7 g/dL (calc) Final   AG Ratio 09/03/2023 1.9  1.0 -  2.5 (calc) Final   Total Bilirubin 09/03/2023 0.5  0.2 - 1.2 mg/dL Final   Alkaline phosphatase (APISO) 09/03/2023 93  35 - 144 U/L Final   AST 09/03/2023 25  10 - 35 U/L Final   ALT 09/03/2023 22  9 - 46 U/L Final   Cholesterol 09/03/2023 114  <200 mg/dL Final   HDL 84/69/6295 35 (L)  > OR = 40 mg/dL Final   Triglycerides 28/41/3244 318 (H)  <150 mg/dL Final   Comment: . If a non-fasting specimen was collected, consider repeat triglyceride testing on a fasting specimen if clinically indicated.  Imagene Mam et al. J. of Clin. Lipidol. 2015;9:129-169. Aaron Aas    LDL Cholesterol (Calc) 09/03/2023 46  mg/dL (calc) Final   Comment: Reference range: <100 . Desirable range <100 mg/dL for primary prevention;   <70 mg/dL for patients with CHD or diabetic patients  with > or = 2 CHD risk factors. Aaron Aas LDL-C is now calculated using the Martin-Hopkins  calculation, which is a validated novel method providing  better accuracy than the Friedewald equation in the  estimation of LDL-C.  Melinda Sprawls et al. Erroll Heard. 0102;725(36): 2061-2068  (http://education.[facility].com/faq/FAQ164)    Total CHOL/HDL Ratio 09/03/2023 3.3  <6.4 (calc) Final   Non-HDL Cholesterol (Calc) 09/03/2023 79  <130 mg/dL (calc) Final   Comment: For patients with diabetes plus 1 major ASCVD risk  factor, treating to a non-HDL-C goal of <100 mg/dL  (LDL-C of <40 mg/dL) is considered a therapeutic  option.    PSA 09/03/2023 1.91  < OR = 4.00 ng/mL Final   Comment: The total PSA value from this assay system is  standardized against the WHO standard.  The test  result will be approximately 20% lower when compared  to the equimolar-standardized total PSA (Beckman  Coulter). Comparison of serial PSA results should be  interpreted with this fact in mind. . This test was performed using the Siemens  chemiluminescent method. Values obtained from  different assay methods cannot be used interchangeably. PSA levels, regardless  of value, should not be interpreted as absolute evidence of the presence or absence of disease.      Past Medical History:  Diagnosis Date   Gout    Hypertriglyceridemia    Macular degeneration    Past Surgical History:  Procedure Laterality Date   APPENDECTOMY  04/09/1956   COLONOSCOPY WITH PROPOFOL      x2   COLONOSCOPY WITH PROPOFOL  N/A 03/28/2023   Procedure: COLONOSCOPY WITH PROPOFOL ;  Surgeon: Marnee Sink, MD;  Location: [facility];  Service: Endoscopy;  Laterality: N/A;   POLYPECTOMY  03/28/2023   Procedure: POLYPECTOMY;  Surgeon: Marnee Sink, MD;  Location: [facility];  Service: Endoscopy;;   ROTATOR CUFF REPAIR  07/07/2010   reattach Bicep also   Current Outpatient Medications on File Prior to Visit  Medication Sig Dispense Refill   allopurinol  (ZYLOPRIM ) 300 MG tablet TAKE 1 TABLET EVERY DAY 90 tablet 2   diphenhydrAMINE (BENADRYL) 25 MG tablet Take 25 mg by mouth every 6 (six) hours as needed for allergies.     gemfibrozil  (LOPID ) 600 MG tablet TAKE 1 TABLET EVERY DAY 90 tablet 3   Multiple Vitamin (MULTIVITAMIN) tablet Take 1 tablet by mouth daily with supper.     Multiple Vitamins-Minerals (ICAPS) CAPS Take 2 capsules by mouth 2 (two) times daily.     OVER THE COUNTER MEDICATION Collengula     OVER THE COUNTER MEDICATION Blueberry     prednisoLONE acetate (PRED FORTE) 1 % ophthalmic suspension Place 1 drop into the right eye 4 (four) times daily.     Turmeric 400 MG CAPS Take by mouth.     VASCEPA  1 g capsule TAKE 2 CAPSULES TWICE DAILY 360 capsule 3   No current facility-administered medications on file prior to visit.   No Known Allergies Social History   Socioeconomic History   Marital status: Married    Spouse name: Not on file   Number of children: Not on file   Years of education: Not on file   Highest education level: Not on file  Occupational History   Not on file  Tobacco Use   Smoking status: Never   Smokeless tobacco: Never   Vaping Use   Vaping status: Never Used  Substance and Sexual Activity   Alcohol use: No   Drug use: No   Sexual activity: Not Currently  Other Topics Concern   Not on file  Social History Narrative   Not on file   Social Drivers of Health   Financial Resource Strain: Low Risk  (09/12/2023)   Overall Financial Resource Strain (CARDIA)    Difficulty of Paying Living Expenses: Not hard at all  Food Insecurity: No Food Insecurity (09/12/2023)   Hunger Vital Sign    Worried About Running Out of Food in the Last Year: Never true    Ran Out of Food in the Last Year: Never true  Transportation Needs: No Transportation Needs (09/12/2023)   PRAPARE - Administrator, Civil Service (Medical): No    Lack of Transportation (Non-Medical): No  Physical Activity: Inactive (09/12/2023)   Exercise Vital Sign    Days of Exercise per Week:  0 days    Minutes of Exercise per Session: 0 min  Stress: No Stress Concern Present (09/12/2023)   [employer] of Occupational Health - Occupational Stress Questionnaire    Feeling of Stress : Not at all  Social Connections: Moderately Isolated (09/12/2023)   Social Connection and Isolation Panel    Frequency of Communication with Friends and Family: More than three times a week    Frequency of Social Gatherings with Friends and Family: Three times a week    Attends Religious Services: Never    Active Member of Clubs or Organizations: No    Attends Banker Meetings: Never    Marital Status: Married  Catering manager Violence: Not At Risk (09/12/2023)   Humiliation, Afraid, Rape, and Kick questionnaire    Fear of Current or Ex-Partner: No    Emotionally Abused: No    Physically Abused: No    Sexually Abused: No   Family History  Problem Relation Age of Onset   Hypertension Mother    Macular degeneration Mother    Cancer Father    Macular degeneration Sister    Early death Paternal Grandfather    Tuberculosis Paternal Grandfather       Review of Systems  All other systems reviewed and are negative.      Objective:   Physical Exam Vitals reviewed.  Constitutional:      General: He is not in acute distress.    Appearance: He is well-developed.  HENT:     Head: Normocephalic and atraumatic.     Right Ear: External ear normal.     Left Ear: External ear normal.     Nose: Nose normal.     Mouth/Throat:     Pharynx: No oropharyngeal exudate.   Eyes:     General: No scleral icterus.       Left eye: No discharge.     Conjunctiva/sclera: Conjunctivae normal.     Pupils: Pupils are equal, round, and reactive to light.   Neck:     Thyroid: No thyromegaly.     Vascular: No JVD.     Trachea: No tracheal deviation.   Cardiovascular:     Rate and Rhythm: Normal rate and regular rhythm.     Heart sounds: Normal heart sounds. No murmur heard.    No friction rub. No gallop.  Pulmonary:     Effort: Pulmonary effort is normal. No respiratory distress.     Breath sounds: Normal breath sounds. No stridor. No wheezing or rales.  Chest:     Chest wall: No tenderness.  Abdominal:     General: Bowel sounds are normal. There is no distension.     Palpations: Abdomen is soft. There is no mass.     Tenderness: There is no abdominal tenderness. There is no guarding or rebound.   Musculoskeletal:        General: No tenderness. Normal range of motion.     Cervical back: Normal range of motion and neck supple.  Lymphadenopathy:     Cervical: No cervical adenopathy.   Skin:    General: Skin is warm.     Coloration: Skin is not pale.     Findings: No erythema or rash.   Neurological:     Mental Status: He is alert and oriented to person, place, and time.     Cranial Nerves: No cranial nerve deficit.     Motor: No abnormal muscle tone.     Coordination: Coordination normal.  Deep Tendon Reflexes: Reflexes are normal and symmetric.   Psychiatric:        Behavior: Behavior normal.        Thought Content:  Thought content normal.        Judgment: Judgment normal.           Assessment & Plan:  Routine general medical examination at a health care facility  Hypertriglyceridemia  Metabolic syndrome Physical exam today is normal.  Blood pressure is well-controlled at 123/78.  Continue to encourage low carbohydrate diet, exercise, and weight loss.  Recommended RSV vaccine however he declines this today.  The remainder of his immunizations is up-to-date.  Colonoscopy is up-to-date.  Prostate cancer screening is within normal limits.  LDL cholesterol is excellent.  Triglycerides are persistently elevated as is his fasting blood sugar.  Recommended a low-fat low carbohydrate diet and aerobic exercise.  Recommended eating a diet rich in vegetables and fruits and low in saturated fat.  Regular anticipatory guidance is provided.

## 2023-10-01 DIAGNOSIS — H353211 Exudative age-related macular degeneration, right eye, with active choroidal neovascularization: Secondary | ICD-10-CM | POA: Diagnosis not present

## 2023-10-03 DIAGNOSIS — M9903 Segmental and somatic dysfunction of lumbar region: Secondary | ICD-10-CM | POA: Diagnosis not present

## 2023-10-03 DIAGNOSIS — M9904 Segmental and somatic dysfunction of sacral region: Secondary | ICD-10-CM | POA: Diagnosis not present

## 2023-10-03 DIAGNOSIS — M9901 Segmental and somatic dysfunction of cervical region: Secondary | ICD-10-CM | POA: Diagnosis not present

## 2023-10-03 DIAGNOSIS — M9902 Segmental and somatic dysfunction of thoracic region: Secondary | ICD-10-CM | POA: Diagnosis not present

## 2023-11-07 DIAGNOSIS — M9903 Segmental and somatic dysfunction of lumbar region: Secondary | ICD-10-CM | POA: Diagnosis not present

## 2023-11-07 DIAGNOSIS — M9902 Segmental and somatic dysfunction of thoracic region: Secondary | ICD-10-CM | POA: Diagnosis not present

## 2023-11-07 DIAGNOSIS — M9901 Segmental and somatic dysfunction of cervical region: Secondary | ICD-10-CM | POA: Diagnosis not present

## 2023-11-07 DIAGNOSIS — M9904 Segmental and somatic dysfunction of sacral region: Secondary | ICD-10-CM | POA: Diagnosis not present

## 2023-12-03 DIAGNOSIS — H353211 Exudative age-related macular degeneration, right eye, with active choroidal neovascularization: Secondary | ICD-10-CM | POA: Diagnosis not present

## 2023-12-03 DIAGNOSIS — H2513 Age-related nuclear cataract, bilateral: Secondary | ICD-10-CM | POA: Diagnosis not present

## 2023-12-03 DIAGNOSIS — H353222 Exudative age-related macular degeneration, left eye, with inactive choroidal neovascularization: Secondary | ICD-10-CM | POA: Diagnosis not present

## 2023-12-05 DIAGNOSIS — M9902 Segmental and somatic dysfunction of thoracic region: Secondary | ICD-10-CM | POA: Diagnosis not present

## 2023-12-05 DIAGNOSIS — M9901 Segmental and somatic dysfunction of cervical region: Secondary | ICD-10-CM | POA: Diagnosis not present

## 2023-12-05 DIAGNOSIS — M9903 Segmental and somatic dysfunction of lumbar region: Secondary | ICD-10-CM | POA: Diagnosis not present

## 2023-12-05 DIAGNOSIS — M9904 Segmental and somatic dysfunction of sacral region: Secondary | ICD-10-CM | POA: Diagnosis not present

## 2023-12-30 DIAGNOSIS — H01003 Unspecified blepharitis right eye, unspecified eyelid: Secondary | ICD-10-CM | POA: Diagnosis not present

## 2023-12-30 DIAGNOSIS — H16223 Keratoconjunctivitis sicca, not specified as Sjogren's, bilateral: Secondary | ICD-10-CM | POA: Diagnosis not present

## 2024-01-02 DIAGNOSIS — M9904 Segmental and somatic dysfunction of sacral region: Secondary | ICD-10-CM | POA: Diagnosis not present

## 2024-01-02 DIAGNOSIS — M9903 Segmental and somatic dysfunction of lumbar region: Secondary | ICD-10-CM | POA: Diagnosis not present

## 2024-01-02 DIAGNOSIS — M9901 Segmental and somatic dysfunction of cervical region: Secondary | ICD-10-CM | POA: Diagnosis not present

## 2024-01-02 DIAGNOSIS — M9902 Segmental and somatic dysfunction of thoracic region: Secondary | ICD-10-CM | POA: Diagnosis not present

## 2024-01-07 ENCOUNTER — Other Ambulatory Visit: Payer: Self-pay | Admitting: Family Medicine

## 2024-01-20 DIAGNOSIS — H16223 Keratoconjunctivitis sicca, not specified as Sjogren's, bilateral: Secondary | ICD-10-CM | POA: Diagnosis not present

## 2024-01-23 DIAGNOSIS — Z0100 Encounter for examination of eyes and vision without abnormal findings: Secondary | ICD-10-CM | POA: Diagnosis not present

## 2024-01-30 DIAGNOSIS — M9904 Segmental and somatic dysfunction of sacral region: Secondary | ICD-10-CM | POA: Diagnosis not present

## 2024-01-30 DIAGNOSIS — M9902 Segmental and somatic dysfunction of thoracic region: Secondary | ICD-10-CM | POA: Diagnosis not present

## 2024-01-30 DIAGNOSIS — M9901 Segmental and somatic dysfunction of cervical region: Secondary | ICD-10-CM | POA: Diagnosis not present

## 2024-01-30 DIAGNOSIS — M9903 Segmental and somatic dysfunction of lumbar region: Secondary | ICD-10-CM | POA: Diagnosis not present

## 2024-02-13 DIAGNOSIS — M9903 Segmental and somatic dysfunction of lumbar region: Secondary | ICD-10-CM | POA: Diagnosis not present

## 2024-02-13 DIAGNOSIS — M9904 Segmental and somatic dysfunction of sacral region: Secondary | ICD-10-CM | POA: Diagnosis not present

## 2024-02-13 DIAGNOSIS — M9902 Segmental and somatic dysfunction of thoracic region: Secondary | ICD-10-CM | POA: Diagnosis not present

## 2024-02-13 DIAGNOSIS — M9901 Segmental and somatic dysfunction of cervical region: Secondary | ICD-10-CM | POA: Diagnosis not present

## 2024-02-18 DIAGNOSIS — H353211 Exudative age-related macular degeneration, right eye, with active choroidal neovascularization: Secondary | ICD-10-CM | POA: Diagnosis not present

## 2024-02-20 DIAGNOSIS — M9903 Segmental and somatic dysfunction of lumbar region: Secondary | ICD-10-CM | POA: Diagnosis not present

## 2024-02-20 DIAGNOSIS — M9901 Segmental and somatic dysfunction of cervical region: Secondary | ICD-10-CM | POA: Diagnosis not present

## 2024-02-20 DIAGNOSIS — M9904 Segmental and somatic dysfunction of sacral region: Secondary | ICD-10-CM | POA: Diagnosis not present

## 2024-02-20 DIAGNOSIS — M9902 Segmental and somatic dysfunction of thoracic region: Secondary | ICD-10-CM | POA: Diagnosis not present

## 2024-02-27 DIAGNOSIS — M9902 Segmental and somatic dysfunction of thoracic region: Secondary | ICD-10-CM | POA: Diagnosis not present

## 2024-02-27 DIAGNOSIS — M9901 Segmental and somatic dysfunction of cervical region: Secondary | ICD-10-CM | POA: Diagnosis not present

## 2024-02-27 DIAGNOSIS — M9904 Segmental and somatic dysfunction of sacral region: Secondary | ICD-10-CM | POA: Diagnosis not present

## 2024-02-27 DIAGNOSIS — M9903 Segmental and somatic dysfunction of lumbar region: Secondary | ICD-10-CM | POA: Diagnosis not present

## 2024-03-03 DIAGNOSIS — M9903 Segmental and somatic dysfunction of lumbar region: Secondary | ICD-10-CM | POA: Diagnosis not present

## 2024-03-03 DIAGNOSIS — M9901 Segmental and somatic dysfunction of cervical region: Secondary | ICD-10-CM | POA: Diagnosis not present

## 2024-03-03 DIAGNOSIS — M9904 Segmental and somatic dysfunction of sacral region: Secondary | ICD-10-CM | POA: Diagnosis not present

## 2024-03-03 DIAGNOSIS — M9902 Segmental and somatic dysfunction of thoracic region: Secondary | ICD-10-CM | POA: Diagnosis not present

## 2024-03-12 DIAGNOSIS — M9904 Segmental and somatic dysfunction of sacral region: Secondary | ICD-10-CM | POA: Diagnosis not present

## 2024-03-12 DIAGNOSIS — M9901 Segmental and somatic dysfunction of cervical region: Secondary | ICD-10-CM | POA: Diagnosis not present

## 2024-03-12 DIAGNOSIS — M9902 Segmental and somatic dysfunction of thoracic region: Secondary | ICD-10-CM | POA: Diagnosis not present

## 2024-03-12 DIAGNOSIS — M9903 Segmental and somatic dysfunction of lumbar region: Secondary | ICD-10-CM | POA: Diagnosis not present

## 2024-03-26 ENCOUNTER — Other Ambulatory Visit: Payer: Self-pay | Admitting: Family Medicine

## 2024-03-26 NOTE — Telephone Encounter (Unsigned)
 Copied from CRM #[id number]. Topic: Clinical - Medication Refill >> Mar 26, 2024  4:13 PM Selinda RAMAN wrote: Medication: VASCEPA  1 g capsule  Has the patient contacted their pharmacy? Yes   This is the patient's preferred pharmacy:  [facility] - [place], [place] - [address] [place] [address] Phone: [phone] Fax: [phone]    Is this the correct pharmacy for this prescription? Yes If no, delete pharmacy and type the correct one.   Has the prescription been filled recently? No  Is the patient out of the medication? No  Has the patient been seen for an appointment in the last year OR does the patient have an upcoming appointment? Yes  Can we respond through MyChart? No he would prefer a call if possible  Please assist patient further

## 2024-03-30 MED ORDER — ICOSAPENT ETHYL 1 G PO CAPS: 2.0000 g | ORAL_CAPSULE | Freq: Two times a day (BID) | ORAL | 3 refills | Status: AC

## 2024-03-30 NOTE — Telephone Encounter (Signed)
 Requested medication (s) are due for refill today: yes  Requested medication (s) are on the active medication list: yes  Last refill:  01/14/23  Future visit scheduled: no  Notes to clinic:    Medication not assigned to a protocol, review manually.     Requested Prescriptions  Pending Prescriptions Disp Refills   icosapent  Ethyl (VASCEPA ) 1 g capsule 360 capsule 3    Sig: Take 2 capsules (2 g total) by mouth 2 (two) times daily.     Off-Protocol Failed - 03/30/2024  4:26 PM      Failed - Medication not assigned to a protocol, review manually.      Passed - Valid encounter within last 12 months    Recent Outpatient Visits           6 months ago Routine general medical examination at a health care facility   [facility] Duanne Butler DASEN, MD   1 year ago Routine general medical examination at a health care facility   [facility] Duanne Butler DASEN, MD   2 years ago Acute otitis media, unspecified otitis media type   [facility] Kayla Jeoffrey RAMAN, FNP

## 2024-09-17 ENCOUNTER — Encounter

## 2024-09-23 ENCOUNTER — Other Ambulatory Visit

## 2024-09-28 ENCOUNTER — Encounter: Admitting: Family Medicine
